# Patient Record
Sex: Male | Born: 1970 | Race: Black or African American | Hispanic: No | Marital: Married | Smoking: Light tobacco smoker
Health system: Southern US, Community
[De-identification: ages and names within clinical notes are randomized; demographics above are authoritative.]

## PROBLEM LIST (undated history)

## (undated) DIAGNOSIS — G4733 Obstructive sleep apnea (adult) (pediatric): Secondary | ICD-10-CM

## (undated) DIAGNOSIS — K219 Gastro-esophageal reflux disease without esophagitis: Secondary | ICD-10-CM

## (undated) DIAGNOSIS — I10 Essential (primary) hypertension: Secondary | ICD-10-CM

## (undated) HISTORY — DX: Essential (primary) hypertension: I10

## (undated) HISTORY — PX: VASECTOMY: SHX75

## (undated) HISTORY — DX: Obstructive sleep apnea (adult) (pediatric): G47.33

---

## 2000-12-03 ENCOUNTER — Emergency Department (HOSPITAL_COMMUNITY): Admission: EM | Admit: 2000-12-03 | Discharge: 2000-12-03 | Payer: Self-pay | Admitting: Emergency Medicine

## 2000-12-03 ENCOUNTER — Encounter: Payer: Self-pay | Admitting: Emergency Medicine

## 2001-10-28 ENCOUNTER — Emergency Department (HOSPITAL_COMMUNITY): Admission: EM | Admit: 2001-10-28 | Discharge: 2001-10-28 | Payer: Self-pay | Admitting: *Deleted

## 2001-11-01 ENCOUNTER — Emergency Department (HOSPITAL_COMMUNITY): Admission: EM | Admit: 2001-11-01 | Discharge: 2001-11-01 | Payer: Self-pay | Admitting: Emergency Medicine

## 2003-01-26 ENCOUNTER — Emergency Department (HOSPITAL_COMMUNITY): Admission: EM | Admit: 2003-01-26 | Discharge: 2003-01-26 | Payer: Self-pay | Admitting: Emergency Medicine

## 2003-02-02 ENCOUNTER — Emergency Department (HOSPITAL_COMMUNITY): Admission: EM | Admit: 2003-02-02 | Discharge: 2003-02-02 | Payer: Self-pay | Admitting: Emergency Medicine

## 2005-01-31 ENCOUNTER — Inpatient Hospital Stay (HOSPITAL_COMMUNITY): Admission: RE | Admit: 2005-01-31 | Discharge: 2005-02-04 | Payer: Self-pay | Admitting: *Deleted

## 2005-03-30 ENCOUNTER — Ambulatory Visit (HOSPITAL_COMMUNITY): Admission: RE | Admit: 2005-03-30 | Discharge: 2005-03-30 | Payer: Self-pay | Admitting: Surgery

## 2005-06-02 ENCOUNTER — Ambulatory Visit (HOSPITAL_COMMUNITY): Admission: RE | Admit: 2005-06-02 | Discharge: 2005-06-02 | Payer: Self-pay | Admitting: Surgery

## 2005-06-09 IMAGING — CT CT ABDOMEN W/ CM
1 of 3 series · 14 of 32 positions shown, 19 images · IV contrast (gastro & omni 300 100ml)
Comparison: none

CLINICAL DATA: Perirectal pain.  Evaluate for abscess.
TECHNIQUE: Contiguous 5 mm axial images of the abdomen and pelvis were obtained after oral and 100 cc of nonionic intravenous contrast was administered.  
CT ABDOMEN WITH CONTRAST:
The liver, spleen, stomach, duodenum, pancreas, gallbladder, adrenal glands, and kidneys have normal features.  No free fluid or adenopathy.

[Series 2: routine abdomen · axial · 0.79mm/px · z∈[-481,-81]mm · 14 of 91 slices shown, 19 images]
[im 6/91  soft-tissue]
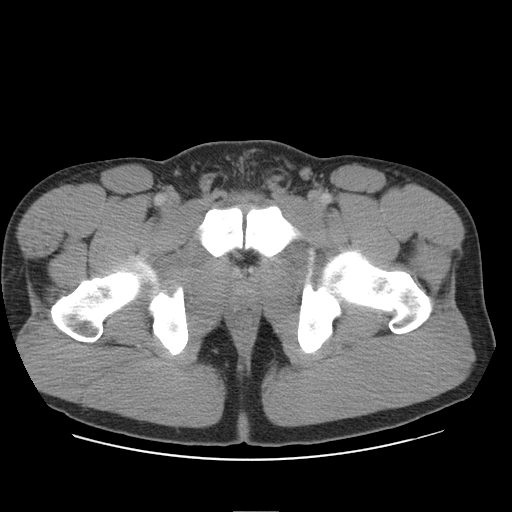
[im 6/91  bone]
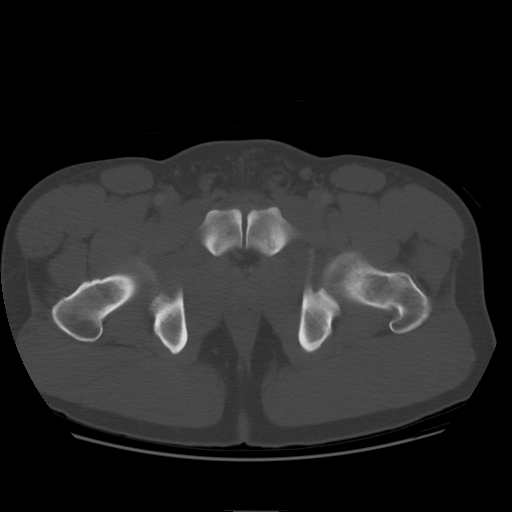
[im 11/91  soft-tissue]
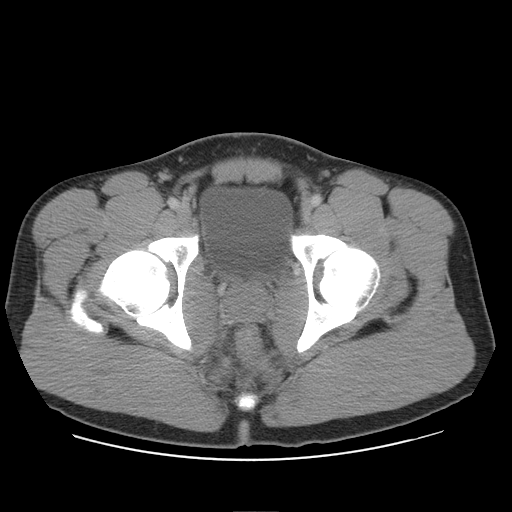
[im 21/91  soft-tissue]
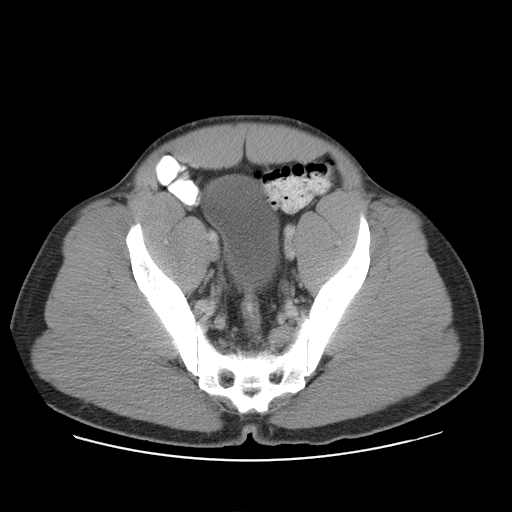
[im 26/91  soft-tissue]
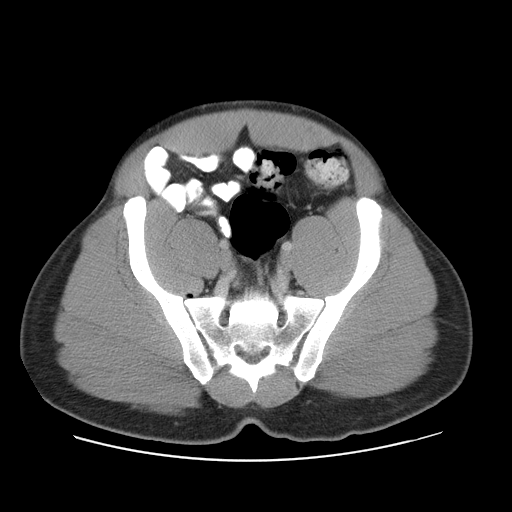
[im 31/91  soft-tissue]
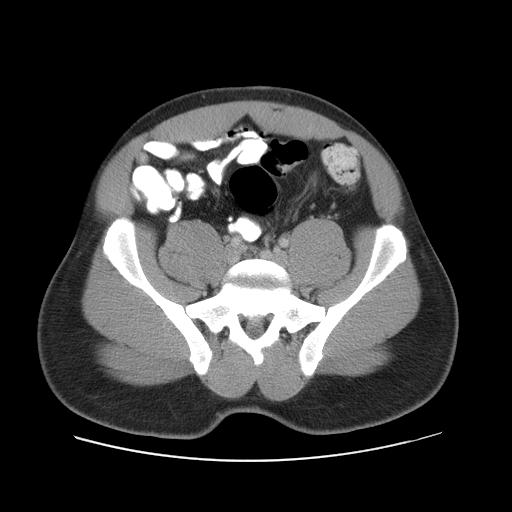
[im 41/91  soft-tissue]
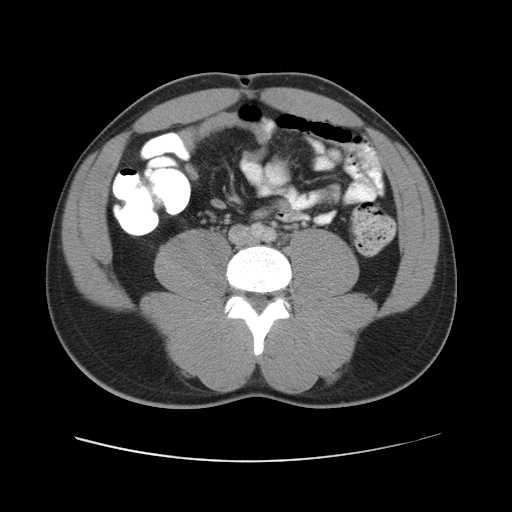
[im 46/91  soft-tissue]
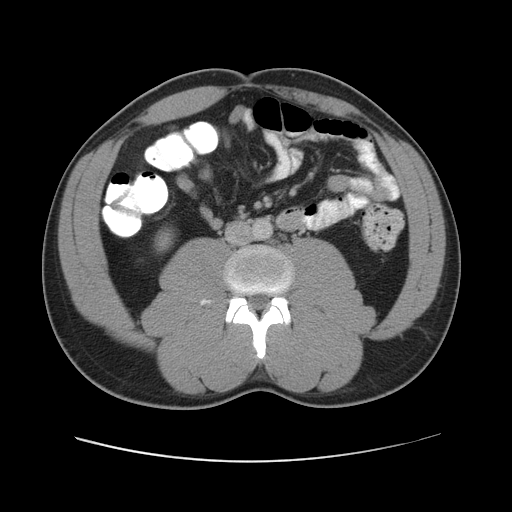
[im 51/91  soft-tissue]
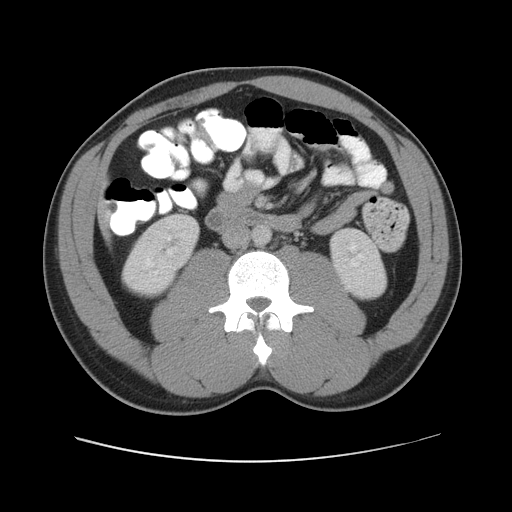
[im 61/91  soft-tissue]
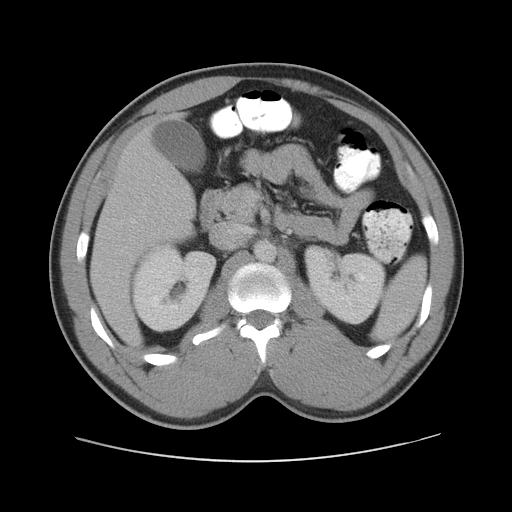
[im 61/91  bone]
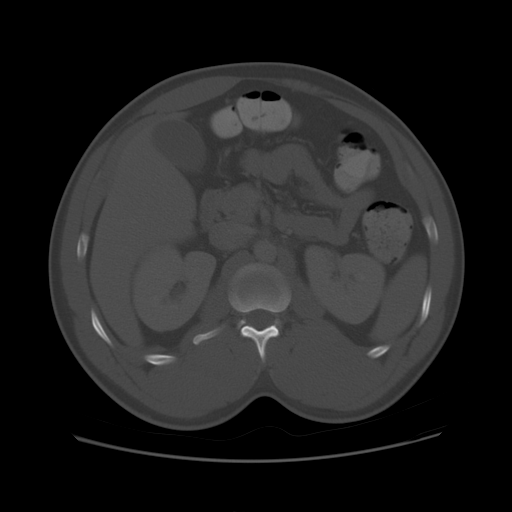
[im 66/91  soft-tissue]
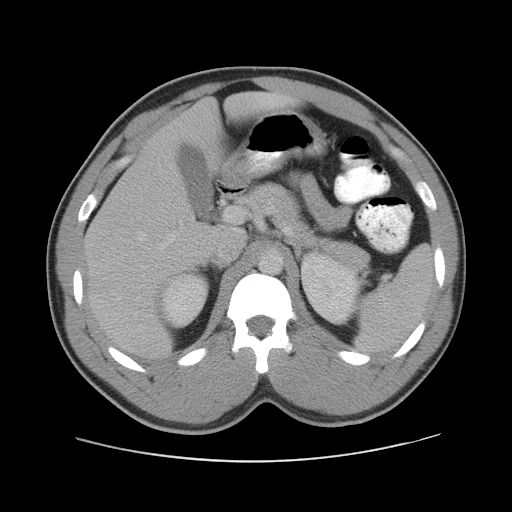
[im 71/91  soft-tissue]
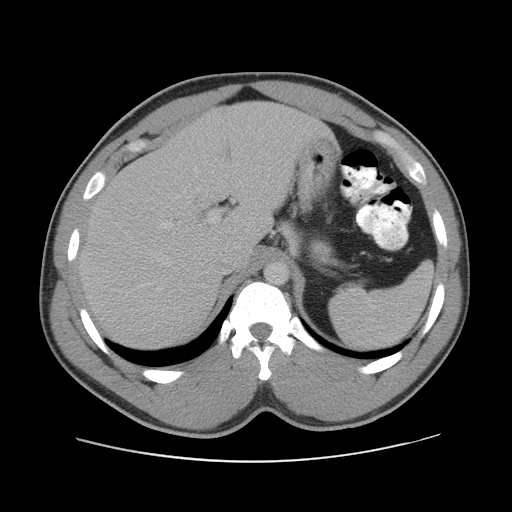
[im 71/91  lung]
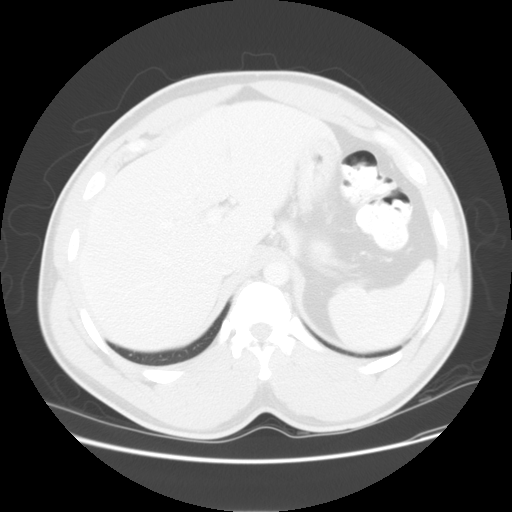
[im 76/91  lung]
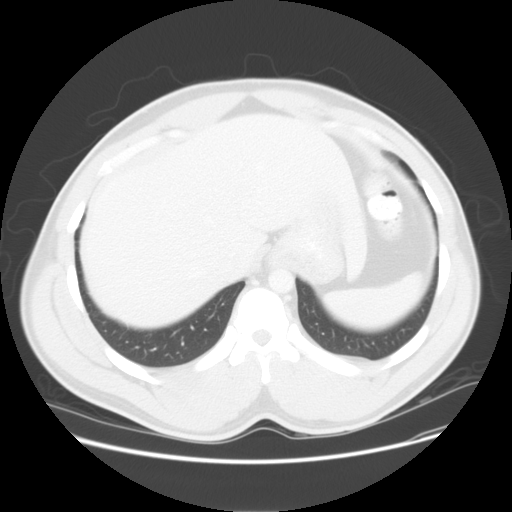
[im 81/91  soft-tissue]
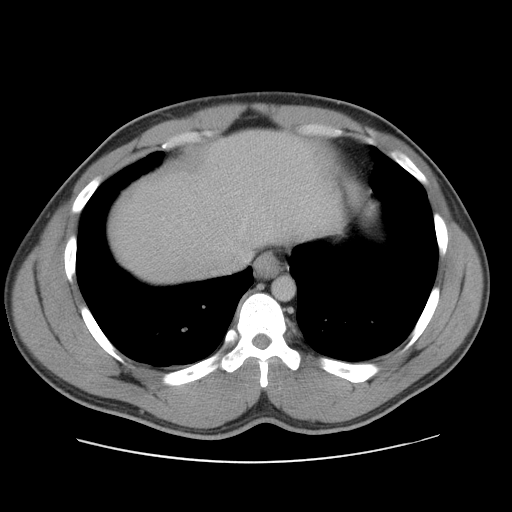
[im 81/91  lung]
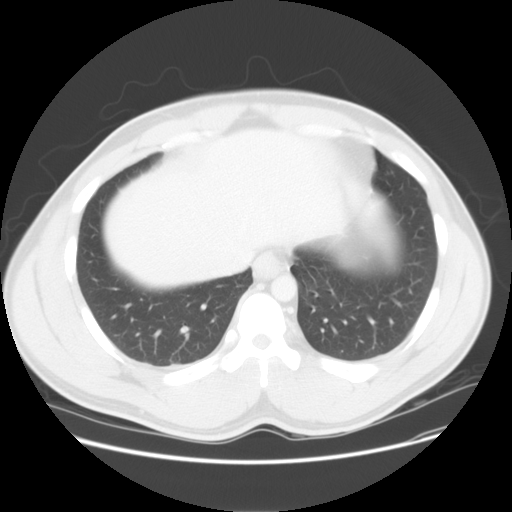
[im 86/91  soft-tissue]
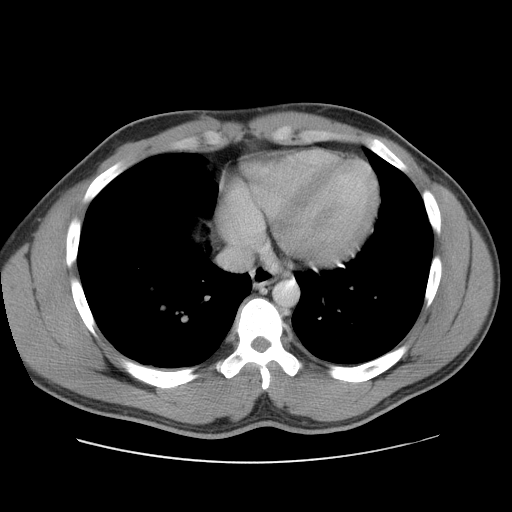
[im 86/91  lung]
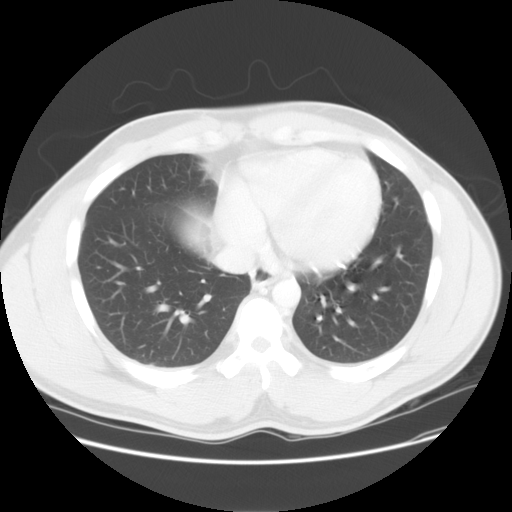

[14 of 32 positions shown; findings below may reference images not displayed]

IMPRESSION: Unremarkable CT exam of the abdomen.  
CT PELVIS WITH CONTRAST:
Imaging through the anatomic pelvis shows no intraperitoneal free fluid.  The appendix and terminal ileum are unremarkable.  
A tiny low density focus is seen in the anterior bladder which is not as low attenuation as would be expected for air although this may be related to volume averaging.
Subtle perirectal edema is noted and at the level of the anus, there is an incompletely visualized fluid collection with air locules, suspicious for perirectal/perianal abscess.  
A tiny locule of air adjacent to the right SI joint is presumably related to the SI joint itself.  It is possible, but considered less likely that this has tracked from the region of the anus.
IMPRESSION: 2.6 x 2.7 cm fluid collection with associated gas locules in the region of the anus is suspicious for a perianal abscess.  he area has been incompletely visualized and it is possible that this fluid is actually within the anus itself although this is considered less likely.

## 2005-06-23 ENCOUNTER — Ambulatory Visit (HOSPITAL_BASED_OUTPATIENT_CLINIC_OR_DEPARTMENT_OTHER): Admission: RE | Admit: 2005-06-23 | Discharge: 2005-06-23 | Payer: Self-pay | Admitting: Surgery

## 2005-06-23 ENCOUNTER — Ambulatory Visit (HOSPITAL_COMMUNITY): Admission: RE | Admit: 2005-06-23 | Discharge: 2005-06-23 | Payer: Self-pay | Admitting: Surgery

## 2005-08-11 ENCOUNTER — Ambulatory Visit (HOSPITAL_BASED_OUTPATIENT_CLINIC_OR_DEPARTMENT_OTHER): Admission: RE | Admit: 2005-08-11 | Discharge: 2005-08-11 | Payer: Self-pay | Admitting: Surgery

## 2005-08-11 ENCOUNTER — Ambulatory Visit (HOSPITAL_COMMUNITY): Admission: RE | Admit: 2005-08-11 | Discharge: 2005-08-11 | Payer: Self-pay | Admitting: Surgery

## 2006-04-21 ENCOUNTER — Encounter (INDEPENDENT_AMBULATORY_CARE_PROVIDER_SITE_OTHER): Payer: Self-pay | Admitting: *Deleted

## 2006-04-21 ENCOUNTER — Ambulatory Visit (HOSPITAL_BASED_OUTPATIENT_CLINIC_OR_DEPARTMENT_OTHER): Admission: RE | Admit: 2006-04-21 | Discharge: 2006-04-21 | Payer: Self-pay | Admitting: Plastic Surgery

## 2007-04-28 ENCOUNTER — Observation Stay (HOSPITAL_COMMUNITY): Admission: EM | Admit: 2007-04-28 | Discharge: 2007-04-29 | Payer: Self-pay | Admitting: *Deleted

## 2011-04-12 NOTE — Op Note (Signed)
NAMEKRZYSZTOF, REICHELT NO.:  [id number]   MEDICAL RECORD NO.:  [id number]          PATIENT TYPE:  INP   LOCATION:  5007                         FACILITY:  [facility]   PHYSICIAN:  Harvie Junior, M.D.   DATE OF BIRTH:  February 14, 1971   DATE OF PROCEDURE:  DATE OF DISCHARGE:  04/28/2007                               OPERATIVE REPORT   PREOPERATIVE DIAGNOSIS:  Patellar tendon rupture, right.   POSTOPERATIVE DIAGNOSIS:  Patellar tendon rupture, right.   PROCEDURE PERFORMED:  Primary repair of patellar tendon rupture, right.   SURGEON:  Harvie Junior, M.D.   ASSISTANT SURGEON:  __________   ANESTHESIA:  General.   BRIEF HISTORY:  Mr. Kadlec is a 40 year old male with a long history of  having had a left patellar tendon rupture at around 30.  The right  patellar tendon ruptured playing basketball.  He was seen in the  emergency room where x-ray showed that he had a high-riding patella and  a  __________ .  Once this was evaluated, the patient could not do  straight leg raising, and he was noted to have a patellar tendon  rupture.  We talked about treatment options, and ultimately felt that  primary repair was the most favorable course of action.  He was brought  to the operating room for this procedure.   DESCRIPTION OF PROCEDURE:  The patient was brought to the operating  room.  After adequate anesthesia under general anesthetic, the patient  was placed on operating table, and the right leg was then prepped and  draped in the usual sterile fashion.  Following this, the leg was  exsanguinated.  The blood pressure tourniquet was inflated to 350 mm Hg.  Following this, a #5 Ethibond was used in the central portion of the  patellar tendon dead center and a figure-of-eight weave medially and  laterally, and a #2 Ethibond in a figure-of-eight weave.  Drill holes  were then placed into the patella x four and parallel from inferior to  superior after roughening up the  surface of the bone for where the  patellar tendon would insert.  Once this was done, the suture were then  passed through the drill hole and the #5 Ethibond suture was tied, the  #2 Ethibond sutures were tied.  Excellent range of motion and stability  of his tendon were achieved and the knee could easily move to 90 degrees  without undue tension on the repair.  There was no gap in the repairs  when it came into extension.  At this point the wound was copiously and  thoroughly irrigated.  The medial and lateral retinacular rents were  repaired with 0 Vicryl running suture; the bursa was repaired with 0  Vicryl running suture; and then the layer was closed with 2-0 Vicryl and  skin staples.  A sterile compressive dressing was applied as well as a  knee immobilizer.  The patient was taken to the recovery room and was  noted to be satisfactory condition.  Estimated blood loss for the  procedure was nil.  Harvie Junior, M.D.  Electronically Signed     JLG/MEDQ  D:  04/28/2007  T:  04/29/2007  Job:  [id number]

## 2011-04-15 NOTE — H&P (Signed)
Wayne Nelson, ROBINETTE NO.:  [id number]   MEDICAL RECORD NO.:  [id number]          PATIENT TYPE:  OBV   LOCATION:  2854                         FACILITY:  [facility]   PHYSICIAN:  Althea Grimmer. Santogade, M.D.DATE OF BIRTH:  Jan 30, 1971   DATE OF ADMISSION:  01/31/2005  DATE OF DISCHARGE:                                HISTORY & PHYSICAL   Mr. Ingam is a  healthy, robust-appearing 40 year old male who presented to  [facility] complaining of constipation and rectal pain  on January 29, 2005.  At that time, he was afebrile.  He described intermittent  severe throbbing in the rectal area.  He was treated with suppositories and  MiraLax.  Symptoms persisted so he was sent for CT scan of the abdomen and  pelvis this morning.  The CT scan showed circumferential rectal wall  thickening suggestive of proctitis with perirectal inflammation in the  posterior rectal fat.  No obvious abscess was seen, however, there was air  in the retroperitoneum along the medial margin of the iliopsoas muscles at  the SI joint.  Patient has been running low grade fevers and having  diaphoresis.  He denies any prior history of similar problems or any  personal or family history of inflammatory bowel disease.  He has not had  any diarrhea or rectal bleeding.   PAST MEDICAL HISTORY:  Pertinent only for reflux.   CURRENT MEDICATION:  Over-the-counter antireflux medicines.   ALLERGIES:  None reported.   FAMILY HISTORY:  Negative for inflammatory bowel disease.   SOCIAL HISTORY:  Lives with girlfriend.  Denies any history of sexually  transmitted diseases, nonsmoker, social drinker.   REVIEW OF SYMPTOMS:  GENERAL:  No weight loss.  Positive for diaphoresis and  sweat.  ENDOCRINE:  No history of diabetes or thyroid problems.  SKIN:  No  rash or pruritus.  EYES:  No icterus or change in vision.  ENT:  No aphthous  ulcers or chronic sore throat.  RESPIRATORY:  No shortness of breath,  cough  or wheezing.  CARDIAC:  No chest pain, palpitations or history of valvular  heart disease.  GI:  As above. GU: No dysuria or hematuria.  Remainder of  review of systems is negative.   PHYSICAL EXAMINATION:  VITAL SIGNS:  Temperature 99.2, blood pressure  144/84, pulse 85 and regular.  SKIN:  Normal.  HEENT:  Eyes:  Anicteric.  Oropharynx unremarkable.  NECK:  Supple without thyromegaly.  There is no cervical or inguinal  adenopathy.  CHEST:  Chest sounds clear.  CARDIOVASCULAR:  Heart sounds regular rate and rhythm.  ABDOMEN:  Overweight and soft without mass.  There may be minimal left lower  quadrant tenderness to deep palpation.  RECTAL:  Exam is quite tender.  I do not appreciate any fissures.   PROCEDURE:  Colonoscopy was performed with sedation and there was no  evidence of inflammatory bowel disease from rectum to cecum.  The ileocecal  valve could not be traversed.  On withdrawal through the rectum on  retroflexed view the rectum was seen to fill with pus which  was suctioned  clean and again pooled indicating that there is some probable abscess  drainage into the rectum.   IMPRESSION:  Probable perirectal abscess which is spontaneously draining  into the rectum as well as into the retroperitoneum.   PLAN:  The patient is admitted to the hospital for IV antibiotics, Zosyn is  ordered.  Blood cultures will be done.  I have also done rectal GC and  chlamydia cultures.  Please see the orders.      PJS/MEDQ  D:  01/31/2005  T:  01/31/2005  Job:  [id number]   cc:   Tracey Harries, M.D.  [address]  [place]  [place] [address]  Fax: [phone]   Margaretmary Bayley, M.D.  [address], [address]  [place]  [place] [address]  Fax: [phone]   Currie Paris, M.D.  [address]  [place]  [place] [address]

## 2011-04-15 NOTE — Op Note (Signed)
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   OPERATION:  Completion anal fistulotomy.   SURGEON:  Dr. Jamey Ripa.   ANESTHESIA:  General.   CLINICAL HISTORY:  Mr. Ben is a 40 year old gentleman who presented many  months ago with a large perirectal abscess which was drained and he  recovered, but developed a fistula. We made one attempt to fix this with  Surgisys fistula plug, but it was a complex fistula and this failed. He has  been brought back to the operating room on a couple of occasions with  partial fistulotomies and placement of seton. At the present time he still  has a seton left and we felt that this was appropriate tine to remove this  and complete the fistulotomy.   DESCRIPTION OF PROCEDURE:  The patient was seen in the holding area and had  no further questions.   He is taken to the operating room and after satisfactory general anesthesia  had been obtained, he was placed in lithotomy position and the perianal area  prepped and draped. The time-out occurred.   I injected 0.25% Marcaine with epi around the area of the fistula. It had  been a fairly long fistula tract both internal and external. Externally it  had healed over to the level of the seton. I then did an anoscopic exam and  internally it had healed again just to the level of fistula and the residual  tissue seemed to be just a very small perhaps 2 mm band.   I divided the remaining fistula with cautery. I debrided the fistula tract  with a 4x4 to rough it up and make sure there is no  epithelization left to  interfere with final healing. I then made sure everything was dry using  cautery.   Sterile dressings applied. The patient tolerated the procedure well.  There  were no complications.      Currie Paris, M.D.  Electronically Signed     CJS/MEDQ  D:  08/11/2005  T:  08/11/2005  Job:  [id number]

## 2011-04-15 NOTE — Op Note (Signed)
NAMEHICKS, FEICK NO.:  [id number]   MEDICAL RECORD NO.:  [id number]          PATIENT TYPE:  INP   LOCATION:  5729                         FACILITY:  [facility]   PHYSICIAN:  Currie Paris, M.D.DATE OF BIRTH:  07/09/1971   DATE OF PROCEDURE:  02/02/2005  DATE OF DISCHARGE:                                 OPERATIVE REPORT   PREOPERATIVE DIAGNOSIS:  Perirectal abscess.   POSTOPERATIVE DIAGNOSIS:  Perirectal abscess.   OPERATION PERFORMED:  Examination under anesthesia with drainage of  perirectal abscess.   SURGEON:  Currie Paris, M.D.   ANESTHESIA:  General.   INDICATIONS FOR PROCEDURE:  This is a 40 year old gentleman who presented  with some rectal pain and initial colonoscopic evaluation because of a  question of colitis on a CT scan resulted in the spontaneous drainage of  what appeared to be a perirectal abscess.  He was placed on IV antibiotics  but continued to have perianal, perirectal pain and follow-up CT today  showed development of a collection which had not been apparent on a prior  CT.  He was therefore brought to the operating room for further drainage.   DESCRIPTION OF PROCEDURE:  The patient was seen in the holding area and had  no further questions.  The patient was taken to the operating room and after  satisfactory general anesthesia, the perianal area was prepped and draped.  In doing so, we noticed purulent material drainage per anus.  The time out  occurred.   I placed an anoscope and could see an opening drainage purulent material in  the rectum.  A digital exam showed a perirectal abscess which I was able to  break up some loculations digitally.  Cultures were taken.  This thing  tracked anteriorly and to the right anterolateral and beyond the level of  the sphincters.  It was very superficial so I elected to make a  counterincision here so we could get both external and internal drainage.  I  did not make this incision  very large because I felt most of his drainage  was opened interiorly and there was a fairly large internal draining area.  I placed a Penrose drain through so that both areas would stay open and felt  that with this drainage established, hopefully this would improve but this  was a fairly large abscess cavity noted.  The patient tolerated the  procedure well.  There were no operative complications. Counts were correct.      CJS/MEDQ  D:  02/02/2005  T:  02/02/2005  Job:  [id number]

## 2011-04-15 NOTE — Op Note (Signed)
[name], Wayne Nelson NO.:  [id number]   MEDICAL RECORD NO.:  [id number]          PATIENT TYPE:  AMB   LOCATION:  DSC                          FACILITY:  [facility]   PHYSICIAN:  Currie Paris, M.D.DATE OF BIRTH:  11-30-1970   DATE OF PROCEDURE:  DATE OF DISCHARGE:                                 OPERATIVE REPORT   [facility] [phone].   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   OPERATION:  Debridement of fistula in ano with placement of seton.   SURGEON:  Dr. Jamey Ripa   ANESTHESIA:  General.   CLINICAL HISTORY:  40 year old gentleman had a complex fistula in ano  develop following drainage of a large perirectal abscess several months ago.  He is brought the operating few weeks ago where we were able to do a partial  fistulectomy for the external part, but it went fairly deep around a lot of  sphincter muscle so a seton was placed.   He was seen in the office and clinically had improved and the seton was  still in place but we were unable to manipulate this in the office with a  local so that we decided to bring him back to the operating room to do a  better exam and debridement.   DESCRIPTION OF PROCEDURE:  The patient was seen in the holding area and he  had no further questions. He was taken to have room and after satisfactory  general anesthesia had been obtained, was placed lithotomy position. The  time out occurred.   The fistula tract still was opened with the seton in place.  I was able to  readily put a hemostat through this. It had clearly shortened down such that  we only had about a centimeter of tissue left that had not cut through.  Distal to the anus there was a lot of shaggy granulations and these were  roughly debrided with edge of a gauze. I did not see any other evidence of  any separate entry into the rectum.   I replaced the previous seton with another two sutures of 0 silk,  having  taken out the original two. These were both tied down snugly. I felt at this  point these might well cut through and hopefully eliminate this or perhaps  just need a  partial fistulotomy one last time under anesthesia depending on how this  did. Prior to starting, I did inject about 10 cc of 0.25% Marcaine with epi.  The patient tolerated procedure well and there were no operative  complications. The wound was packed. All counts were correct.       CJS/MEDQ  D:  06/23/2005  T:  06/23/2005  Job:  [id number]

## 2011-04-15 NOTE — Op Note (Signed)
NAME:  Wayne Nelson, Wayne Nelson NO.:  [id number]   MEDICAL RECORD NO.:  [id number]          PATIENT TYPE:  AMB   LOCATION:  DAY                          FACILITY[facility]   PHYSICIAN:  Currie Paris, M.D.DATE OF BIRTH:  10/26/1971   DATE OF PROCEDURE:  03/30/2005  DATE OF DISCHARGE:                                 OPERATIVE REPORT   OFFICE MEDICAL RECORD NUMBER:  [id number].   PREOPERATIVE DIAGNOSIS:  Fistula in ano, status post drainage of large  perirectal abscess.   POSTOPERATIVE DIAGNOSIS:  Fistula in ano, status post drainage of large  perirectal abscess - complex fistula with two external openings.   OPERATION:  Exam under anesthesia, with partial fistulotomy and placement of  Surgisys fistula plug.   SURGEON:  Currie Paris, M.D.   ANESTHESIA:  General.   CLINICAL HISTORY:  This is a 40 year old gentleman who several weeks ago  underwent drainage of a large perirectal abscess.  He has done well but has  had a continuous fistulous drainage.  There were two small openings which I  thought communicated subcutaneously on the right anterior aspect of his  perianal area.  After discussion with the patient, we elected to bring him  to the operating room for possible fistulectomy, fistulotomy, or placement  of a Surgisys fistula plug.   DESCRIPTION OF PROCEDURE:  The patient was seen in the holding area and had  no further questions.  He was taken to the operating room, and after  satisfactory general anesthesia had been obtained, he was placed in the  lithotomy position, and the perianal area was prepped and draped.  The  timeout occurred.   On exam, he had two openings externally anteriorly, one more about the 11  o'clock position, and more about the 10 o'clock position on the right  anterior.  Using an anoscope, I could not definitely see an internal  opening, but using the more anterior of the two fistula openings, I was  readily able to get a probe  into the rectum.  A probe was placed in the  second fistulous opening, and this seemed to track toward the first opening,  not directly inside.  I temporarily removed the probe and irrigated both  tracts with peroxide, trying to just debride out any debris.  I then  prepared the Surgisys plug and tied a suture to the small tip end of it.  I  put a probe back into the rectum and brought the plug through so that I  could see how it fit.  I then selected where I would cut the internal part  of it off so that we had a good fit and placed a 3-0 Vicryl suture and tied  it there.  I pulled it back into the tract and made sure we had it fitting  properly.  Then, we cut off the excess.  The catheter was pulled into the  tract, and using the still attached needle to the suture, closed the  submucosa and mucosa over the fistula plug, incorporating the plug into the  suture and tying it down.  A second suture was done to re-anchor this and be  sure we had good mucosal closure over the fistula plug.   Externally, we had this pulled up so that it was snug, and I used a 3-0  Vicryl to anchor it to the skin, but left the fistulous tract opening open,  and cut this off flush with the skin.  I then used the cautery to  communicate the second  fistula tract over to the plug and just left this open as an external  fistulotomy.  I injected Marcaine with epinephrine to help with the  postoperative analgesia.   The patient tolerated the procedure well.  There were no complications.  All  counts were correct.      CJS/MEDQ  D:  03/30/2005  T:  03/30/2005  Job:  [id number]

## 2011-04-15 NOTE — Consult Note (Signed)
Wayne Nelson, MAYOTTE NO.:  [id number]   MEDICAL RECORD NO.:  [id number]          PATIENT TYPE:  OBV   LOCATION:  5729                         FACILITY:  [facility]   PHYSICIAN:  Currie Paris, M.D.DATE OF BIRTH:  12/09/1970   DATE OF CONSULTATION:  02/01/2005  DATE OF DISCHARGE:                                   CONSULTATION   REASON FOR CONSULTATION:  Rectal abscess.   HISTORY OF PRESENT ILLNESS:  Mr. Wayne Nelson is a 40 year old male patient with a  1 week history of perirectal pain.  He sought initial medial therapy from  his primary care physician.  CT scan revealed a circumferential rectal wall  thickening with air at the retroperitoneal location with questionable  microperforation.  He was then admitted to the hospital and Dr. Luther Parody  had performed a colonoscopy which showed pus pooling in the rectal area and  felt that the perirectal abscess had spontaneously ruptured.  He was placed  on IV Zosyn and a general surgery consult was obtained.  The patient does  admit that he still has some pain, although he does feel like there is some  relief today.   ALLERGIES:  No known drug allergies.   MEDICATIONS:  None prior to admission.  He is currently on Zosyn.   PAST MEDICAL HISTORY:  No pertinent past medical history.   PAST SURGICAL HISTORY:  Right knee surgery in the past.   SOCIAL HISTORY:  He lives in [place] with his girlfriend.  Denies any  tobacco or illicit drug use.  He does drink socially.   FAMILY HISTORY:  No family history of colon cancers or major health issues.  Parents alive and well.   REVIEW OF SYSTEMS:  GASTROINTESTINAL:  Painful bowel habits.  GENITOURINARY:  Denies any rectal oozing.  All other review of systems are negative.   PHYSICAL EXAMINATION:  VITAL SIGNS:  Temperature 98.8, pulse 72,  respirations 20, blood pressure 130/60, O2 saturations 95% on room air.  GENERAL:  He is in no acute distress.  HEENT:  Normal.  NECK:   No JVD or thyromegaly.  CHEST:  Clear to auscultation bilaterally.  No wheeze, rales or rhonchi.  HEART:  Regular rate and rhythm with no murmur.  ABDOMEN:  Normoactive bowel sounds, nontender, nondistended, no masses or  bruits.  RECTAL:  Perirectal tenderness, right greater than left.  There is no pus  noted.  No open lesions.  EXTREMITIES:  No cyanosis or clubbing of lower extremities.  No peripheral  edema.  NEUROLOGIC:  Cranial nerves 2-12 grossly intact.   LABORATORY DATA AND X-RAY FINDINGS:  Blood cultures x2 with no growth x1  day.  Sodium 132, potassium 3.6, BUN 8, creatinine 1.1.  LFTs are normal.  CBC reveals a white count of 9.6, hemoglobin 14.5, hematocrit 41.7,  platelets 273.   ASSESSMENT:  Perirectal abscess with probable spontaneous rupture.   RECOMMENDATIONS:  We will check a pelvic computed tomography with rectal  contrast in the morning to see progress of this area.  In the meantime, will  continue intravenous Zosyn.  The patient was seen  and examined by Dr.  Currie Paris, M.D.      LB/MEDQ  D:  02/01/2005  T:  02/01/2005  Job:  [id number]   cc:   Currie Paris, M.D.  [address], [address]  [place]  [place] [address]

## 2011-04-15 NOTE — Op Note (Signed)
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   OPERATION:  Rectal exam under anesthesia with partial fistulotomy and  placement of seton.   SURGEON:  Dr. Jamey Ripa.   ANESTHESIA:  General.   CLINICAL HISTORY:  This a 40 year old man who presented several months ago  with a large perirectal abscess. We had a resultant fistula in ano and about  two months ago we took him back to the operating room and placed a SurgiSys  plug, but he had a complex fistula with two external openings and the plug  failed with persistent fistula. He is brought back to the operating room  with plans to try to do an open fistulotomy. It was felt that the internal  opening is fairly high up and we might not be able to open this completely  because of risk of injury to the sphincter.   DESCRIPTION OF PROCEDURE:  The patient was seen in the holding area and he  had no further questions.  He was taken to the operating room and after  satisfactory general anesthesia had been obtained, he was placed in the  prone position. The perineal area and rectal area was prepped and draped as  sterile field. Time-out occurred.   With an anoscope placed in the rectum, I could see a little inflammatory  process slightly to the right of midline posteriorly. I was able to irrigate  some peroxide through both external openings and see they appeared to  communicate and enter into a single opening internally. I was then able to  place readily a rectal probe through each of these and they both came into  the same  internal opening. 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 I debrided that somewhat using rough gauze and left that opened. We  then had a single relatively short tract into the inside.  Rather than cut  through all the posterior sphincter I then placed a seton of #1 silk tying  it down snugly but not too tight.   I felt at this point that the best approach would be to try to let this heal  externally so that we were then left with a relatively short fistula which  would be controlled with a seton. Either in the office or in the operating  room we could replace the seton and try to get this to completely heal  depending on  how the external areas did.  Another alternative might be an  attempt again with a plug given that we would have hopefully a more  simplified fistula.   I injected some 0.25% Marcaine with epi to help control postop pain and I  placed dressing.   The patient tolerated procedure well. No operative complications. All counts  correct.       CJS/MEDQ  D:  06/02/2005  T:  06/02/2005  Job:  [id number]

## 2011-04-15 NOTE — Op Note (Signed)
NAMEDENORRIS, REUST NO.:  [id number]   MEDICAL RECORD NO.:  [id number]          PATIENT TYPE:  AMB   LOCATION:  DSC                          FACILITY:  [facility]   PHYSICIAN:  Etter Sjogren, M.D.     DATE OF BIRTH:  [birth year]   DATE OF PROCEDURE:  04/21/2006  DATE OF DISCHARGE:                                 OPERATIVE REPORT   PREOPERATIVE DIAGNOSIS:  1.  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  2.  Lesion of undetermined behavior, left thigh.  3.  Lesion of undetermined behavior, left knee.   POSTOPERATIVE DIAGNOSES:  1.  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  2.  Lesion of undetermined behavior, left thigh.  3.  Lesion of undetermined behavior, left knee.  4.  Complicated wound scalp greater than 2.5 cm.   PROCEDURE PERFORMED:  1.  Complex wound closure scalp greater than 2.5 cm.  2.  Excision lesion of undetermined behavior, left thigh greater than 1 cm.  3.  Excision lesion of left knee greater than 1 cm.   SURGEON:  Etter Sjogren, M.D.   ANESTHESIA:  1% Xylocaine with epinephrine plus bicarb.   CLINICAL NOTE:  This 40 year old man has a couple of lesions, one left  thigh, one left knee, that have been large and changed, he would like to  have those removed.  In addition, he has a scar on his scalp that has become  a cheloid and is expanding and is symptomatic.  It is painful and it is  medically necessary to remove it.  The anesthesia and the risks discussed  with him and he understood the procedure as well as the possibility of  recurrence of the cheloid on his scalp and wished to proceed.   PROCEDURE:  The patient was placed in the supine position.  The sites were  marked.  He was prepped with Betadine and draped with sterile drapes.  Successful awakening from anesthesia achieved.  The excisions were  performed.  The scalp wound cleansed thoroughly and closed with staples,  taking care to align the skin edges.   The  remainder of the closure is with #4-0 Prolene simple interrupted sutures.  Antibiotic ointment and a dry sterile dressing was applied and tolerated  well.   DISPOSITION:  Return to the office in 10 days.      Etter Sjogren, M.D.  Electronically Signed     DB/MEDQ  D:  04/21/2006  T:  04/21/2006  Job:  [id number]

## 2011-04-15 NOTE — Discharge Summary (Signed)
NAMERANDLE, SHATZER NO.:  [id number]   MEDICAL RECORD NO.:  [id number]          PATIENT TYPE:  INP   LOCATION:  5729                         FACILITY:  [facility]   PHYSICIAN:  Guy Franco, P.A.       DATE OF BIRTH:  [birth year]   DATE OF ADMISSION:  01/31/2005  DATE OF DISCHARGE:  02/04/2005                                 DISCHARGE SUMMARY   DISCHARGE DIAGNOSES:  1.  Perirectal abscess, status post drainage on February 02, 2005.  2.  Status post right knee surgery.   HOSPITAL COURSE:  Wayne Nelson is a 40 year old male patient with a one-week  history of perirectal pain.  He was seen by his primary care physician, and  a CT showed a circumferential rectal wall thickening, with air at the  retroperitoneum location, with questionable microperforation.  He was sent  to the hospital, and Dr. Luther Nelson performed a colonoscopy which showed pus  pooling in the rectum which was thought to be a probable spontaneously  ruptured perirectal abscess.  He was placed on IV Zosyn, and a follow-up CT  was performed.  The CT showed a 2.6 x 2.7 cm perianal abscess, and plans  were made for the patient to go to the operating room for drainage.  This  occurred on February 02, 2005.  The patient tolerated the procedure well.  During the postoperative period, he seemed to tolerate his recovery well.  He was medicated with pain medicine, and at this point all cultures remain  pending.  We are planning to discharge him to home on postoperative day two  in stable condition.  We will discharge him on the following medications:   1.  Augmentin 500 mg one p.o. b.i.d. for 10 days.  2.  Vicodin 1-2 tablets q.6 h. as needed for pain, #40, no refills.  3.  I have recommended Colace 100 mg daily to take over the next two weeks      as a stool softener.   He is instructed not to strain, and no weightlifting for two weeks.  He is  to remain out of work until February 14, 2005.  He is to remain on a lowfat  diet, gentle wound care with soaks.  He is to call [phone] for questions or  concerns.  He has a follow-up appointment with Dr. Jamey Nelson on Wednesday February 09, 2005 at 9:00 a.m.      LB/MEDQ  D:  02/04/2005  T:  02/04/2005  Job:  [id number]   cc:   __________, M.D.   Wayne Nelson. Wayne Nelson, M.D.  [address]  [place]  [place] [address]  Fax: [phone]   Wayne Nelson, M.D.  [address]  [place]  [place] [address]

## 2011-12-21 ENCOUNTER — Ambulatory Visit (INDEPENDENT_AMBULATORY_CARE_PROVIDER_SITE_OTHER): Payer: 59

## 2011-12-21 DIAGNOSIS — R059 Cough, unspecified: Secondary | ICD-10-CM

## 2011-12-21 DIAGNOSIS — R05 Cough: Secondary | ICD-10-CM

## 2011-12-21 DIAGNOSIS — J029 Acute pharyngitis, unspecified: Secondary | ICD-10-CM

## 2011-12-21 DIAGNOSIS — J019 Acute sinusitis, unspecified: Secondary | ICD-10-CM

## 2011-12-26 ENCOUNTER — Ambulatory Visit (INDEPENDENT_AMBULATORY_CARE_PROVIDER_SITE_OTHER): Payer: 59

## 2011-12-26 DIAGNOSIS — J019 Acute sinusitis, unspecified: Secondary | ICD-10-CM

## 2011-12-26 DIAGNOSIS — T730XXA Starvation, initial encounter: Secondary | ICD-10-CM

## 2011-12-26 DIAGNOSIS — R21 Rash and other nonspecific skin eruption: Secondary | ICD-10-CM

## 2012-02-21 ENCOUNTER — Ambulatory Visit (INDEPENDENT_AMBULATORY_CARE_PROVIDER_SITE_OTHER): Payer: 59 | Admitting: Internal Medicine

## 2012-02-21 ENCOUNTER — Encounter: Payer: Self-pay | Admitting: Internal Medicine

## 2012-02-21 VITALS — BP 132/85 | HR 87 | Temp 98.9°F | Resp 16 | Ht 74.0 in | Wt 272.6 lb

## 2012-02-21 DIAGNOSIS — L299 Pruritus, unspecified: Secondary | ICD-10-CM

## 2012-02-21 DIAGNOSIS — R300 Dysuria: Secondary | ICD-10-CM

## 2012-02-21 DIAGNOSIS — Z113 Encounter for screening for infections with a predominantly sexual mode of transmission: Secondary | ICD-10-CM

## 2012-02-21 LAB — POCT URINALYSIS DIPSTICK
Bilirubin, UA: NEGATIVE
Glucose, UA: NEGATIVE
Leukocytes, UA: NEGATIVE
Nitrite, UA: NEGATIVE
pH, UA: 6

## 2012-02-21 LAB — POCT UA - MICROSCOPIC ONLY: Casts, Ur, LPF, POC: NEGATIVE

## 2012-02-21 NOTE — Progress Notes (Signed)
  Subjective:    Patient ID: Wayne Eves., male    DOB: 09/06/1971, 41 y.o.   MRN: [id number]  Rash This is a new problem. The current episode started 1 to 4 weeks ago. The problem is unchanged. There is no history of allergies, asthma or eczema.  Wayne Nelson is here with two complaints:  First he has been itching "all over" for about two weeks with no rash evident.  He switched soaps but still itches despite taking oral Benadryl.  He has no history of asthma or eczema.  He also complains of dysuria which is intermittent.  Recent new sexual partner and no condom use.  Denies drainage.    Review of Systems  HENT: Negative.   Respiratory: Negative.   Cardiovascular: Negative.   Gastrointestinal: Negative.   Skin: Positive for rash.  All other systems reviewed and are negative.       Objective:   Physical Exam  Vitals reviewed. Constitutional: He is oriented to person, place, and time. He appears well-developed and well-nourished.  HENT:  Head: Normocephalic.  Mouth/Throat: Oropharynx is clear and moist.  Eyes: Conjunctivae are normal.  Neck: Neck supple.  Cardiovascular: Normal rate, regular rhythm and normal heart sounds.   Pulmonary/Chest: Effort normal and breath sounds normal.  Abdominal: Soft.  Lymphadenopathy:    He has no cervical adenopathy.  Neurological: He is alert and oriented to person, place, and time.  Skin: Skin is warm and dry.       Sleeve tattoos, no skin lesions noted but skin globally dry  Psychiatric: He has a normal mood and affect. His behavior is normal.          Assessment & Plan:  U/a and dip appear normal.  Uriprobe pending.  Wear condoms! For his itching skin we will place him on antihistamine twice daily and Zantac twice daily for one week.  Use Cetaphil lotion for dry skin.  RTC if not improving or symptoms worsen.

## 2012-02-21 NOTE — Patient Instructions (Addendum)
For your skin itching:  Take Zyrtec 10 mg and Zantac 75 mg twice daily for one week.  Apply Cetaphil lotion to skin after bathing and again in 12 hours to soothe any itch.  Use Dove soap, avoid soaps with fragrance.  Avoid dryer towels and detergents with fragrance.  We will call you with the results of your GC/Chlamydia test in 3 days.

## 2012-02-22 LAB — GC/CHLAMYDIA PROBE AMP, URINE: GC Probe Amp, Urine: NEGATIVE

## 2012-03-14 ENCOUNTER — Ambulatory Visit (INDEPENDENT_AMBULATORY_CARE_PROVIDER_SITE_OTHER): Payer: 59 | Admitting: Physician Assistant

## 2012-03-14 VITALS — BP 150/82 | HR 80 | Temp 98.7°F | Resp 16 | Ht 76.0 in | Wt 277.6 lb

## 2012-03-14 DIAGNOSIS — M25519 Pain in unspecified shoulder: Secondary | ICD-10-CM

## 2012-03-14 DIAGNOSIS — M25511 Pain in right shoulder: Secondary | ICD-10-CM

## 2012-03-14 MED ORDER — MELOXICAM 15 MG PO TABS: 15.0000 mg | ORAL_TABLET | Freq: Every day | ORAL | Status: AC

## 2012-03-14 NOTE — Progress Notes (Signed)
  Subjective:    Patient ID: Wayne Eves., male    DOB: Aug 12, 1971, 41 y.o.   MRN: [id number]  HPI Wayne Nelson is a 41 y/o AA male who presents today with cc right shoulder pain.  He states that he woke up with it. He cannot recall that he slept on it or had any injury.He also has no known history of previous injury to the joint.  He does lift heavy weights at the gym and worked arms and chest 2 days ago. Pain is very localized to the lateral, proximal head or the humerus. Patient has not taken any meds to alleviate pain. Pain is worse with movement of the joint especially abduction and internal rotation. He rates it ar a 7/10 at its worst. He is having difficulty with ADLs such as driving his car.   Denies clicking, grinding or popping. Denies tingling or numbness in his hands.    Review of Systems    as stated in HPI Objective:   Physical Exam  Constitutional: He appears well-developed and well-nourished.       Heavily muscled individual  Musculoskeletal: Normal range of motion.       Right shoulder: He exhibits tenderness (TTP of the tendon of the short head of the biceps) and pain (active ROM is not limited, but patient grimaces with movement). He exhibits no bony tenderness, no swelling, no effusion, no crepitus, no deformity, no laceration, no spasm, normal pulse and normal strength.          Assessment & Plan:   1. Shoulder pain, right  meloxicam (MOBIC) 15 MG tablet  supportive care, see patient instructions

## 2012-03-14 NOTE — Progress Notes (Signed)
Examined with student and agree. csj

## 2012-03-14 NOTE — Patient Instructions (Signed)
Shoulder Pain The shoulder is a ball and socket joint. The muscles and tendons (rotator cuff) are what keep the shoulder in its joint and stable. This collection of muscles and tendons holds in the head (ball) of the humerus (upper arm bone) in the fossa (cup) of the scapula (shoulder blade). Today no reason was found for your shoulder pain. Often pain in the shoulder may be treated conservatively with temporary immobilization. For example, holding the shoulder in one place using a sling for rest. Physical therapy may be needed if problems continue. HOME CARE INSTRUCTIONS   Apply ice to the sore area for 15 to 20 minutes, 3 to 4 times per day for the first 2 days. Put the ice in a plastic bag. Place a towel between the bag of ice and your skin.   If you have or were given a shoulder sling and straps, do not remove for as long as directed by your caregiver or until you see a caregiver for a follow-up examination. If you need to remove it to shower or bathe, move your arm as little as possible.   Sleep on several pillows at night to lessen swelling and pain.   Only take over-the-counter or prescription medicines for pain, discomfort, or fever as directed by your caregiver.   Keep any follow-up appointments in order to avoid any type of permanent shoulder disability or chronic pain problems.  SEEK MEDICAL CARE IF:   Pain in your shoulder increases or new pain develops in your arm, hand, or fingers.   Your hand or fingers are colder than your other hand.   You do not obtain pain relief with the medications or your pain becomes worse.  SEEK IMMEDIATE MEDICAL CARE IF:   Your arm, hand, or fingers are numb or tingling.   Your arm, hand, or fingers are swollen, painful, or turn white or blue.   You develop chest pain or shortness of breath.  MAKE SURE YOU:   Understand these instructions.   Will watch your condition.   Will get help right away if you are not doing well or get worse.    Document Released: 08/24/2005 Document Revised: 11/03/2011 Document Reviewed: 10/29/2011 [employer] Patient Information 2012 [employer]. 

## 2012-12-06 ENCOUNTER — Ambulatory Visit (INDEPENDENT_AMBULATORY_CARE_PROVIDER_SITE_OTHER): Payer: 59 | Admitting: Family Medicine

## 2012-12-06 VITALS — BP 133/87 | HR 83 | Temp 98.0°F | Resp 16 | Ht 73.0 in | Wt 274.8 lb

## 2012-12-06 DIAGNOSIS — M549 Dorsalgia, unspecified: Secondary | ICD-10-CM

## 2012-12-06 DIAGNOSIS — IMO0002 Reserved for concepts with insufficient information to code with codable children: Secondary | ICD-10-CM

## 2012-12-06 DIAGNOSIS — S39012A Strain of muscle, fascia and tendon of lower back, initial encounter: Secondary | ICD-10-CM

## 2012-12-06 MED ORDER — HYDROCODONE-ACETAMINOPHEN 5-500 MG PO TABS: 1.0000 | ORAL_TABLET | ORAL | Status: DC | PRN

## 2012-12-06 MED ORDER — CYCLOBENZAPRINE HCL 10 MG PO TABS: 10.0000 mg | ORAL_TABLET | Freq: Three times a day (TID) | ORAL | Status: DC | PRN

## 2012-12-06 MED ORDER — NABUMETONE 750 MG PO TABS: 750.0000 mg | ORAL_TABLET | Freq: Two times a day (BID) | ORAL | Status: DC

## 2012-12-06 MED ORDER — KETOROLAC TROMETHAMINE 60 MG/2ML IM SOLN
60.0000 mg | Freq: Once | INTRAMUSCULAR | Status: AC
  Administered 2012-12-06: 60 mg via INTRAMUSCULAR

## 2012-12-06 NOTE — Progress Notes (Signed)
Subjective: Patient was weight lifting at the gym yesterday. When he lifted the bar off of the rash he had his back tighten out. The weights weighed close to 500 pounds. He is than this before, about a year ago, he had his back strain. He lifts weights about 5 days a week. 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  Objective: Muscular man in obvious distress, unable to sit up or move around without obvious pain. There is no radiating pain reported. He is very tight in the paraspinous muscles of the low back, and somewhat around over the iliac crest areas. When he flexes forward the spine remains very straight.  Assessment: Lumbar strain  Plan: Muscle relaxants anti-inflammatory medications and pain relievers. Recheck in 4-5 days to see if he needs physical therapy.

## 2012-12-06 NOTE — Patient Instructions (Signed)
Alternate heat and ice packs on your low back several times daily  Try to stretch the back gently.  Stay off work until you're seen on Monday. We may need to see if physical therapy or other rehabilitation for this back as needed.

## 2012-12-06 NOTE — Addendum Note (Signed)
Addended by: Arnita Koons H on: 12/06/2012 09:35 AM   Modules accepted: Orders

## 2013-04-22 ENCOUNTER — Other Ambulatory Visit: Payer: Self-pay | Admitting: Neurology

## 2013-06-23 ENCOUNTER — Other Ambulatory Visit: Payer: Self-pay | Admitting: Family Medicine

## 2013-06-24 ENCOUNTER — Telehealth: Payer: Self-pay

## 2013-06-24 ENCOUNTER — Ambulatory Visit (INDEPENDENT_AMBULATORY_CARE_PROVIDER_SITE_OTHER): Payer: 59 | Admitting: Family Medicine

## 2013-06-24 VITALS — BP 140/90 | HR 83 | Temp 98.6°F | Resp 18 | Ht 73.0 in | Wt 274.0 lb

## 2013-06-24 DIAGNOSIS — IMO0002 Reserved for concepts with insufficient information to code with codable children: Secondary | ICD-10-CM

## 2013-06-24 DIAGNOSIS — M62830 Muscle spasm of back: Secondary | ICD-10-CM

## 2013-06-24 DIAGNOSIS — S39012A Strain of muscle, fascia and tendon of lower back, initial encounter: Secondary | ICD-10-CM

## 2013-06-24 MED ORDER — CYCLOBENZAPRINE HCL 10 MG PO TABS: 10.0000 mg | ORAL_TABLET | Freq: Three times a day (TID) | ORAL | Status: DC | PRN

## 2013-06-24 MED ORDER — HYDROCODONE-ACETAMINOPHEN 5-325 MG PO TABS: 1.0000 | ORAL_TABLET | Freq: Four times a day (QID) | ORAL | Status: DC | PRN

## 2013-06-24 MED ORDER — KETOROLAC TROMETHAMINE 60 MG/2ML IM SOLN
60.0000 mg | Freq: Once | INTRAMUSCULAR | Status: AC
  Administered 2013-06-24: 60 mg via INTRAMUSCULAR

## 2013-06-24 MED ORDER — NABUMETONE 750 MG PO TABS: 750.0000 mg | ORAL_TABLET | Freq: Two times a day (BID) | ORAL | Status: DC

## 2013-06-24 NOTE — Patient Instructions (Addendum)
I recommend keeping the nabumetone on board - esp taking before and during work. Then when you come home, take a muscle relaxant followed by 15 minutes of heat followed by gentle stretching. Try to do this regiment 2 - 3 times a day.  If you are still having pain in 4 to 6 wks, come back to clinic for further eval. RTC immed if symptoms worsen or you develop any other concerning symptoms below.  Start considering looking into some yoga classes to prevent future injuries

## 2013-06-24 NOTE — Telephone Encounter (Signed)
Pt still c/o of lower back pain and would like to know if he could have a refill on the muscle relaxer that was prescribed to him by Dr.Hopper (pt does not know the name of the medication), pt also is aware that Dr.Hopper is not in the office at this time but needs this medication asap. Best# [phone]   [facility] [address]

## 2013-06-24 NOTE — Progress Notes (Signed)
Subjective:    Patient ID: Wayne Eves., male    DOB: 03-Jul-1971, 42 y.o.   MRN: [id number] Chief Complaint  Patient presents with  . Back Pain    x 2-3 days     HPI  Must have overdone it at the gym as when he got out of bed yesterday had severe pain.  Better with moving but if he is sitting or laying down for longer it gets worse. Is in center of his low back without radiation, no numbness or weakness in legs though legs feel week due to pain initially at times.  Feels similar to other episodes in the past. No f/c, no changes in bowels/bladder.  Past Medical History  Diagnosis Date  . Hypertension    Current Outpatient Prescriptions on File Prior to Visit  Medication Sig Dispense Refill  . amLODipine-olmesartan (AZOR) 5-40 MG per tablet Take 1 tablet by mouth daily.      . Armodafinil (NUVIGIL) 150 MG tablet Take 1 tablet (150 mg total) by mouth daily as needed (for daytime sleepiness).  30 tablet  5  . loratadine (CLARITIN) 10 MG tablet Take 10 mg by mouth daily.      Marland Kitchen omeprazole (PRILOSEC) 20 MG capsule Take 20 mg by mouth daily.       No current facility-administered medications on file prior to visit.   No Known Allergies   Review of Systems  Constitutional: Negative for fever and chills.  Gastrointestinal: Negative for abdominal pain, diarrhea and constipation.  Genitourinary: Negative for urgency, frequency, decreased urine volume and difficulty urinating.  Musculoskeletal: Positive for myalgias and back pain. Negative for gait problem.  Skin: Negative for color change, rash and wound.  Neurological: Negative for dizziness, weakness, light-headedness and numbness.      BP 140/90  Pulse 83  Temp(Src) 98.6 F (37 C) (Oral)  Resp 18  Ht 6\' 1"  (1.854 m)  Wt 274 lb (124.286 kg)  BMI 36.16 kg/m2  SpO2 98% Objective:   Physical Exam  Constitutional: He is oriented to person, place, and time. He appears well-developed and well-nourished. No distress.  HENT:   Head: Normocephalic and atraumatic.  Cardiovascular: Intact distal pulses.   Pulmonary/Chest: Effort normal.  Musculoskeletal: He exhibits no edema and no tenderness.       Thoracic back: He exhibits normal range of motion, no tenderness, no bony tenderness, no swelling, no deformity and no spasm.       Lumbar back: He exhibits decreased range of motion and spasm. He exhibits no tenderness, no bony tenderness, no edema and no deformity.  Negative straight leg raise bilaterally  Neurological: He is alert and oriented to person, place, and time. He has normal strength. He displays no atrophy. No sensory deficit. He exhibits normal muscle tone. Coordination and gait normal.  Reflex Scores:      Patellar reflexes are 0 on the right side and 0 on the left side.      Achilles reflexes are 1+ on the right side and 1+ on the left side. Poor patellar reflexes due to h/o bilateral patellar surgery  Skin: Skin is warm and dry. No rash noted. He is not diaphoretic. No erythema.  Psychiatric: He has a normal mood and affect. His behavior is normal.      Assessment & Plan:  Lumbar paraspinal muscle spasm - Plan: ketorolac (TORADOL) injection 60 mg  Back strain, initial encounter - Plan: cyclobenzaprine (FLEXERIL) 10 MG tablet, nabumetone (RELAFEN) 750 MG tablet  Meds  ordered this encounter  Medications  . ketorolac (TORADOL) injection 60 mg    Sig:   . cyclobenzaprine (FLEXERIL) 10 MG tablet    Sig: Take 1 tablet (10 mg total) by mouth 3 (three) times daily as needed for muscle spasms.    Dispense:  30 tablet    Refill:  1  . nabumetone (RELAFEN) 750 MG tablet    Sig: Take 1 tablet (750 mg total) by mouth 2 (two) times daily.    Dispense:  30 tablet    Refill:  1  . HYDROcodone-acetaminophen (NORCO/VICODIN) 5-325 MG per tablet    Sig: Take 1 tablet by mouth every 6 (six) hours as needed for pain.    Dispense:  20 tablet    Refill:  1

## 2013-06-24 NOTE — Telephone Encounter (Signed)
Spoke w/pt who reported that his back has started bothering him again after lifting weights again this week. Pt agreed to RTC today to be rechecked.

## 2015-04-06 ENCOUNTER — Ambulatory Visit (INDEPENDENT_AMBULATORY_CARE_PROVIDER_SITE_OTHER): Payer: 59 | Admitting: Neurology

## 2015-04-06 ENCOUNTER — Encounter: Payer: Self-pay | Admitting: Neurology

## 2015-04-06 VITALS — BP 140/82 | HR 88 | Resp 20 | Ht 74.41 in | Wt 283.0 lb

## 2015-04-06 DIAGNOSIS — G4726 Circadian rhythm sleep disorder, shift work type: Secondary | ICD-10-CM | POA: Diagnosis not present

## 2015-04-06 DIAGNOSIS — G4733 Obstructive sleep apnea (adult) (pediatric): Secondary | ICD-10-CM

## 2015-04-06 DIAGNOSIS — Z9989 Dependence on other enabling machines and devices: Principal | ICD-10-CM

## 2015-04-06 NOTE — Progress Notes (Signed)
SLEEP MEDICINE CLINIC   Provider:  Melvyn Novasarmen  Dondrell Loudermilk, M D  Referring Provider: Rolly PancakePatterson, Robert, MD Primary Care Physician:  Rolly PancakePATTERSON,ROBERT, MD  Chief Complaint  Patient presents with  . Follow-up    cpap, rm 10, alone    HPI:  Wayne EvesCharles R Beers Jr. is an african american right handed  44 y.o. male and   seen here as a referral/ revisit  from Dr. Jarold MottoPatterson for sleep apnea follow up.  Mr. Derrell Lollingngram was last seen by me on 01-28-13. On 04-28-09 he was evaluated for sleep apnea ( AHI 31.8)  and a split-night protocol titration was taking place. He was using the machine regularly but has not been initially had not been initially able to maintain it. The machine could not provide a download and therefore we changed it to a to a ResMed machine. The split study had documented that a setting of 7 cm water pressure eliminated the AHI. But a pressure of 13 cm water was needed to eliminate the snoring.  He initially had trouble getting adjusted to CPAP but soon felt that his sleep improved. The last download on file was from 1-20 8-13, and documented a residual AHI of 2.2 at 11 cm water pressure with 2 cm EPR. Time the patient was 87% compliance 26 out of 30 days.Prior to the study, he had endorsed the Epworth sleepiness score at 22 points, and after CPAP use this number became 11 points. He reported sleeping longer and felt rested.On 01-28-13 the evaluated his machine last here for CPAP compliance. At the same time he reported that he still has high blood pressures also he was treated on amlodipine and lisinopril. His CPAP was increased to 11 cm water was to send meter EPR AHI was 4.1 and he used it daily over 90 days with an average of 6 hours.  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  In spite of his changing jobs and  shift work he reports that he is using CPAP daily and very compliantly. Epworth score today is endorsed at 10 points and his fatigue severity score is 31 points both are in average range.  The patient reports that he now needs a little longer to fall asleep when he puts his CPAP on and has to do some breathing exercises almost a little meditation to settle. But he is not insomnia by any means. His changing sleep cycles affect of course the hours of sleep he can get through the day. He reports that he sweats more often or sweats more at night. He is a regular exerciser and visits the gym at least 2 times a week. His fiance has noticed that he has restless sleep, moving about to fall asleep and  is moving restlessly during his sleep.  No nocturia. Usually sleeps 6 hours , some nights 7.5 hours. He feels refreshed and restored , no headaches . He sleeps in a cool bedroom but not quiet and dark bedroom, that he shares with his fiancee, he likes to sleep on his back.    Review of Systems: Out of a complete 14 system review, the patient complains of only the following symptoms, and all other reviewed systems are negative.   Epworth score  10, Fatigue severity score  31  , depression score 3    History   Social History  . Marital Status: Single    Spouse Name: N/A  . Number of Children: N/A  . Years of Education: N/A   Occupational History  . Not on file.   Social History Main Topics  . Smoking status: Never Smoker   . Smokeless tobacco: Not on file  . Alcohol Use: No     Comment: 1 beer a week  . Drug Use: No  . Sexual Activity: Not on file   Other Topics Concern  . Not on file   Social History Narrative   No caffeine consumption.    Family History  Problem Relation Age of Onset  . Hypertension Father     Past Medical History  Diagnosis Date  . Hypertension   . OSA (obstructive sleep apnea)     Past Surgical History  Procedure Laterality Date  . Vasectomy       Current Outpatient Prescriptions  Medication Sig Dispense Refill  . amLODipine (NORVASC) 5 MG tablet Take 5 mg by mouth daily.  9  . Armodafinil (NUVIGIL) 150 MG tablet Take 1 tablet (150 mg total) by mouth daily as needed (for daytime sleepiness). 30 tablet 5  . irbesartan (AVAPRO) 300 MG tablet Take 300 mg by mouth daily.  12  . loratadine (CLARITIN) 10 MG tablet Take 10 mg by mouth daily.    Marland Kitchen omeprazole (PRILOSEC) 20 MG capsule Take 20 mg by mouth daily.    Marland Kitchen amLODipine-olmesartan (AZOR) 5-40 MG per tablet Take 1 tablet by mouth daily.    . cyclobenzaprine (FLEXERIL) 10 MG tablet Take 1 tablet (10 mg total) by mouth 3 (three) times daily as needed for muscle spasms. (Patient not taking: Reported on 04/06/2015) 30 tablet 1  . HYDROcodone-acetaminophen (NORCO/VICODIN) 5-325 MG per tablet Take 1 tablet by mouth every 6 (six) hours as needed for pain. (Patient not taking: Reported on 04/06/2015) 20 tablet 1  . nabumetone (RELAFEN) 750 MG tablet Take 1 tablet (750 mg total) by mouth 2 (two) times daily. (Patient not taking: Reported on 04/06/2015) 30 tablet 1   No current facility-administered medications for this visit.    Allergies as of 04/06/2015  . (No Known Allergies)    Vitals: BP 140/82 mmHg  Pulse 88  Resp 20  Ht 6' 2.41" (1.89 m)  Wt 283 lb (128.368 kg)  BMI 35.94 kg/m2 Last Weight:  Wt Readings from Last 1 Encounters:  04/06/15 283 lb (128.368 kg)       Last Height:   Ht Readings from Last 1 Encounters:  04/06/15 6' 2.41" (1.89 m)    Physical exam:  General: The patient is awake, alert and appears not in acute distress. The patient is well groomed. Head: Normocephalic, atraumatic. Neck is supple. Mallampati 4,  neck circumference: 22. Nasal airflow  unrestricted , TMJ is not  evident . Retrognathia is  seen.  Cardiovascular:  Regular rate and rhythm, without  murmurs or carotid bruit, and without distended neck veins. Respiratory: Lungs are clear to  auscultation. Skin:  Without evidence of edema, or rash Trunk: BMI is  elevated and patient  has normal posture.  Neurologic exam : The patient is awake and alert, oriented to place and time.   Memory subjective described as intact.  There is a normal attention span & concentration ability. Speech is fluent without dysarthria, dysphonia or aphasia. Mood and affect are appropriate.  Cranial nerves: Pupils are equal and briskly  reactive to light. Funduscopic exam without  evidence of pallor or edema. Extraocular movements  in vertical and horizontal planes intact and without nystagmus.  Visual fields by finger perimetry are intact. Hearing to finger rub intact.  Facial sensation intact to fine touch. Facial motor strength is symmetric and tongue and uvula move midline.  Motor exam:  Normal tone, muscle bulk and symmetric ,strength in all extremities.  Sensory:  Fine touch, pinprick and vibration were tested in all extremities.  Proprioception is normal.  Coordination: Rapid alternating movements in the fingers/hands is normal. Finger-to-nose maneuver  normal without evidence of ataxia, dysmetria or tremor.  Gait and station: Patient walks without assistive device and is able unassisted to climb up to the exam table.  Strength within normal limits. Stance is stable and normal. Tandem gait is unfragmented. Romberg  negative.  Deep tendon reflexes: in the  upper and lower extremities are symmetric and intact. Babinski maneuver response is downgoing.   Assessment:  After physical and neurologic examination, review of laboratory studies, imaging, neurophysiology testing and pre-existing records, 45 minute  assessment is :  1)  OSA compliant CPAP user, download from 04-05-15 reveals a residual AHI of 3.690 day user compliance of 100% and 83% for over 4 hours, average daily use a time 5 hours and 55 minutes. Set pressure 11 cm water was 2 cm EPR. 2)  Obesity, neck 22, BMI - his main risk factor.   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 Addressed,  Lamb I will increase his CPAP pressure by 1 cm. To 12 cm with 2 cm EPR   The patient was advised of the nature of the diagnosed sleep disorder , the treatment options and risks for general a health and wellness arising from not treating the condition. Visit duration was 45 minutes.   Plan:  Treatment plan and additional workup :  increase pressure, advanced home care.  Patient needs new equipment including new tube filter headgear.    Porfirio Mylararmen Marlei Glomski MD  04/06/2015

## 2015-11-02 ENCOUNTER — Ambulatory Visit (INDEPENDENT_AMBULATORY_CARE_PROVIDER_SITE_OTHER): Payer: 59 | Admitting: Emergency Medicine

## 2015-11-02 VITALS — BP 122/84 | HR 87 | Temp 98.4°F | Resp 18 | Ht 74.0 in | Wt 280.4 lb

## 2015-11-02 DIAGNOSIS — S6722XA Crushing injury of left hand, initial encounter: Secondary | ICD-10-CM | POA: Diagnosis not present

## 2015-11-02 DIAGNOSIS — M79645 Pain in left finger(s): Secondary | ICD-10-CM | POA: Diagnosis not present

## 2015-11-02 NOTE — Progress Notes (Signed)
Subjective:  Patient ID: Gabriela Eves., male    DOB: March 22, 1971  Age: 44 y.o. MRN: [id number]  CC: Finger Injury   HPI Chantry Headen. presents  about a month ago he suffered a crush injury of his left index finger when a door was closed on it. He has had continued discomfort in the tip of his finger. The prominent reason for his visit is because the nail is delaminating and he wants to have it debrided.  History Fidel has a past medical history of Hypertension and OSA (obstructive sleep apnea).   He has past surgical history that includes Vasectomy.   His  family history includes Hypertension in his father.  He   reports that he has never smoked. He does not have any smokeless tobacco history on file. He reports that he does not drink alcohol or use illicit drugs.  Outpatient Prescriptions Prior to Visit  Medication Sig Dispense Refill  . amLODipine (NORVASC) 5 MG tablet Take 5 mg by mouth daily.  9  . irbesartan (AVAPRO) 300 MG tablet Take 300 mg by mouth daily.  12  . loratadine (CLARITIN) 10 MG tablet Take 10 mg by mouth daily.    Marland Kitchen omeprazole (PRILOSEC) 20 MG capsule Take 20 mg by mouth daily.    Marland Kitchen amLODipine-olmesartan (AZOR) 5-40 MG per tablet Take 1 tablet by mouth daily.    . Armodafinil (NUVIGIL) 150 MG tablet Take 1 tablet (150 mg total) by mouth daily as needed (for daytime sleepiness). (Patient not taking: Reported on 11/02/2015) 30 tablet 5  . cyclobenzaprine (FLEXERIL) 10 MG tablet Take 1 tablet (10 mg total) by mouth 3 (three) times daily as needed for muscle spasms. (Patient not taking: Reported on 04/06/2015) 30 tablet 1  . HYDROcodone-acetaminophen (NORCO/VICODIN) 5-325 MG per tablet Take 1 tablet by mouth every 6 (six) hours as needed for pain. (Patient not taking: Reported on 04/06/2015) 20 tablet 1  . nabumetone (RELAFEN) 750 MG tablet Take 1 tablet (750 mg total) by mouth 2 (two) times daily. (Patient not taking: Reported on 04/06/2015) 30 tablet 1    No facility-administered medications prior to visit.    Social History   Social History  . Marital Status: Single    Spouse Name: N/A  . Number of Children: N/A  . Years of Education: N/A   Social History Main Topics  . Smoking status: Never Smoker   . Smokeless tobacco: None  . Alcohol Use: No     Comment: 1 beer a week  . Drug Use: No  . Sexual Activity: Not Asked   Other Topics Concern  . None   Social History Narrative   No caffeine consumption.     Review of Systems  Constitutional: Negative for fever, chills and appetite change.  HENT: Negative for congestion, ear pain, postnasal drip, sinus pressure and sore throat.   Eyes: Negative for pain and redness.  Respiratory: Negative for cough, shortness of breath and wheezing.   Cardiovascular: Negative for leg swelling.  Gastrointestinal: Negative for nausea, vomiting, abdominal pain, diarrhea, constipation and blood in stool.  Endocrine: Negative for polyuria.  Genitourinary: Negative for dysuria, urgency, frequency and flank pain.  Musculoskeletal: Negative for gait problem.  Skin: Negative for rash.  Neurological: Negative for weakness and headaches.  Psychiatric/Behavioral: Negative for confusion and decreased concentration. The patient is not nervous/anxious.     Objective:  BP 122/84 mmHg  Pulse 87  Temp(Src) 98.4 F (36.9 C) (Oral)  Resp 18  Ht 6\' 2"  (1.88 m)  Wt 280 lb 6.4 oz (127.189 kg)  BMI 35.99 kg/m2  SpO2 98%  Physical Exam  Constitutional: He is oriented to person, place, and time. He appears well-developed and well-nourished.  HENT:  Head: Normocephalic and atraumatic.  Eyes: Conjunctivae are normal. Pupils are equal, round, and reactive to light.  Pulmonary/Chest: Effort normal.  Musculoskeletal: He exhibits no edema.  Neurological: He is alert and oriented to person, place, and time.  Skin: Skin is dry.  Psychiatric: He has a normal mood and affect. His behavior is normal. Thought  content normal.   His index finger nail has come loose and is delaminating and there is no tenderness of the fingertip    Assessment & Plan:   Leonette MostCharles was seen today for finger injury.  Diagnoses and all orders for this visit:  Crushing injury of finger of left hand, initial encounter  Pain of finger of left hand   I am having Mr. Derrell Lollingngram maintain his amLODipine-olmesartan, omeprazole, loratadine, Armodafinil, cyclobenzaprine, nabumetone, HYDROcodone-acetaminophen, amLODipine, and irbesartan.  No orders of the defined types were placed in this encounter.   After a suitable infiltration with lidocaine in a digital block the nail was removed  Appropriate red flag conditions were discussed with the patient as well as actions that should be taken.  Patient expressed his understanding.  Follow-up: No Follow-up on file.  Carmelina DaneAnderson, Derral Colucci S, MD

## 2016-02-10 ENCOUNTER — Encounter: Payer: Self-pay | Admitting: Neurology

## 2016-04-05 ENCOUNTER — Ambulatory Visit: Payer: 59 | Admitting: Neurology

## 2016-05-04 ENCOUNTER — Ambulatory Visit (INDEPENDENT_AMBULATORY_CARE_PROVIDER_SITE_OTHER): Payer: 59 | Admitting: Neurology

## 2016-05-04 ENCOUNTER — Encounter: Payer: Self-pay | Admitting: Neurology

## 2016-05-04 VITALS — BP 142/98 | HR 84 | Resp 20 | Ht 73.0 in | Wt 293.0 lb

## 2016-05-04 DIAGNOSIS — G4710 Hypersomnia, unspecified: Secondary | ICD-10-CM

## 2016-05-04 DIAGNOSIS — G4733 Obstructive sleep apnea (adult) (pediatric): Secondary | ICD-10-CM

## 2016-05-04 DIAGNOSIS — G4730 Sleep apnea, unspecified: Secondary | ICD-10-CM | POA: Diagnosis not present

## 2016-05-04 DIAGNOSIS — Z9989 Dependence on other enabling machines and devices: Secondary | ICD-10-CM

## 2016-05-04 MED ORDER — MODAFINIL 200 MG PO TABS: 200.0000 mg | ORAL_TABLET | Freq: Every day | ORAL | Status: DC

## 2016-05-04 NOTE — Progress Notes (Addendum)
SLEEP MEDICINE CLINIC   Provider:  Melvyn Novas, M D  Referring Provider:  Jarome Matin, MD   Chief Complaint  Patient presents with  . Follow-up    cpap, going well, rm 11, alone    HPI:  Wayne Nelson. is an african american right handed  45 y.o. male and   seen here as a referral/ revisit  from Dr. Jarome Matin for sleep apnea follow up.   On 04-28-09 he was first evaluated for sleep apnea ( AHI 31.8)  and a split-night protocol titration was taking place. He was using the machine regularly but has not been initially had not been initially able to maintain it. The machine could not provide a download and therefore we changed it to a to a ResMed machine. The split study had documented that a setting of 7 cm water pressure eliminated the AHI. But a pressure of 13 cm water was needed to eliminate the snoring. He initially had trouble getting adjusted to CPAP but soon felt that his sleep improved. The last download on file was from 1-20 8-13, and documented a residual AHI of 2.2 at 11 cm water pressure with 2 cm EPR. Time the patient was 87% compliance 26 out of 30 days.Prior to the study, he had endorsed the Epworth sleepiness score at 22 points, and after CPAP use this number became 11 points. He reported sleeping longer and felt rested.On 01-28-13 the evaluated his machine last here for CPAP compliance. At the same time he reported that he still has high blood pressures also he was treated on amlodipine and lisinopril. His CPAP was increased to 11 cm water was to send meter EPR AHI was 4.1 and he used it daily over 90 days with an average of 6 hours.   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  In spite of his changing jobs and shift work he reports that he is using CPAP daily and very compliantly.  Epworth score today is endorsed at 10 points and his fatigue severity score is 31 points both are in average range. The patient reports that he now needs a little longer to fall asleep when he puts his CPAP on and has to do some breathing exercises almost a little meditation to settle. But he is not insomnia by any means. His changing sleep cycles affect of course the hours of sleep he can get through the day. He reports that he sweats more often or sweats more at night. He is a regular exerciser and visits the gym at least 2 times a week. His fiance has noticed that he has restless sleep, moving about to fall asleep and  is moving restlessly during his sleep. No nocturia. Usually sleeps 6 hours , some nights 7.5 hours. He feels refreshed and restored , no headaches . He sleeps in a cool bedroom but not quiet and dark bedroom, that he shares with his fiancee, he likes to sleep on his back.   Interval history from 05/04/2016. I meeting was office [name] today for his revisit after CPAP titration this is his yearly visit. 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 He has been highly compliant with CPAP use. 100%  of days and 87% of those over 4 hours of nightly use. The machine recorded in average of 5 hours and 28 minutes of use at 11 cm water pressure with 2 cm EPR. The residual AHI is 4.1 all residual apneas are obstructive in nature but he does have some significant air leakage and on some nights I believe this artificially increases his AHI. He also reports that he takes a while to wind down at night before he is able to go to sleep and takes a few mindful breathing exercises. He has used an FX nasal pillow and this dislodges very easily. His fatigue severity score was endorsed today at 29 and his Epworth sleepiness score at 15 points, it is the Epworth sleepiness score that concerns me. I would like for him to be refitted for a different interface and he  is interested in the dream wear mask by [employer]. I will ask his DME to provide him some arthritis to the current model. He is using an extended holes. With the dream ware mask he may be happy a lower risk of entanglling himself. He has noted some libido issues, and these may be related to fatigue. He does not have a history of spinal cord injury, multiple sclerosis, bowel or bladder dysfunction.   Review of Systems: Out of a complete 14 system review, the patient complains of only the following symptoms, and all other reviewed systems are negative. Epworth score up to 15 from  10, Fatigue severity score 29 from  31  , depression score 3 .   Social History   Social History  . Marital Status: Single    Spouse Name: N/A  . Number of Children: N/A  . Years of Education: N/A   Occupational History  . Not on file.   Social History Main Topics  . Smoking status: Never Smoker   . Smokeless tobacco: Not on file  . Alcohol Use: No     Comment: 1 beer a week  . Drug Use: No  . Sexual Activity: Not on file   Other Topics Concern  . Not on file   Social History Narrative   No caffeine consumption.    Family History  Problem Relation Age of Onset  . Hypertension Father     Past Medical History  Diagnosis Date  . Hypertension   . OSA (obstructive sleep apnea)     Past Surgical History  Procedure Laterality Date  . Vasectomy      Current Outpatient Prescriptions  Medication Sig Dispense Refill  . amLODipine (NORVASC) 5 MG tablet Take 5 mg by mouth daily.  9  . irbesartan (AVAPRO) 300 MG tablet Take 300 mg by mouth daily.  12  . loratadine (CLARITIN) 10 MG tablet Take 10 mg by mouth daily.    . modafinil (PROVIGIL) 200 MG tablet 200 mg.    . omeprazole (PRILOSEC) 20 MG capsule Take 20 mg by mouth daily.     No current facility-administered medications for this visit.    Allergies as of 05/04/2016  . (No Known Allergies)    Vitals: BP 142/98 mmHg  Pulse  84  Resp 20  Ht 6\' 1"  (1.854 m)  Wt 293 lb (132.904 kg)  BMI 38.67 kg/m2 Last Weight:  Wt Readings from Last 1 Encounters:  05/04/16 293 lb (132.904 kg)       Last Height:   Ht Readings from Last 1 Encounters:  05/04/16 6\' 1"  (1.854 m)    Physical exam:  General: The patient is awake, alert and appears not in acute distress. The patient is well groomed. Head: Normocephalic, atraumatic. Neck is supple. Mallampati 4,  neck circumference: 22. Nasal airflow  unrestricted , TMJ is not  evident . Retrognathia is  seen.  Cardiovascular:  Regular rate and rhythm, without  murmurs or carotid bruit, and without distended neck veins. Respiratory: Lungs are clear to auscultation. Skin:  Without evidence of edema, or rash Trunk: BMI is  elevated and patient  has normal posture.  Neurologic exam : The patient is awake and alert, oriented to place and time.   Memory subjective described as intact.  There is a normal attention span & concentration ability. Speech is fluent without dysarthria, dysphonia or aphasia. Mood and affect are appropriate. Cranial nerves: Pupils are equal and briskly reactive to light. Funduscopic exam without  evidence of pallor or edema. Extraocular movements  in vertical and horizontal planes intact and without nystagmus.  Visual fields by finger perimetry are intact. Hearing to finger rub intact.  Facial sensation intact to fine touch. Facial motor strength is symmetric and tongue and uvula move midline.   Assessment:  After physical and neurologic examination, review of laboratory studies, imaging, neurophysiology testing and pre-existing records, 45 minute  assessment is :  1)  OSA compliant CPAP user, download user compliance of 100% for days  and 87% for over 4 hours, average daily use a time 5 hours and and 28 min. CPAP pressure at 11 cm with 2 cm EPR.  AHI 4.1    2)  Obesity, neck 22, BMI - his main risk factor.  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 Addressed,   The patient was advised of the nature of the diagnosed sleep disorder , the treatment options and risks for general a health and wellness arising from not treating the condition. Visit duration was 25 minutes.   Plan:  Treatment plan and additional workup :  increase pressure, advanced home care.  Patient needs new equipment including new tube filter headgear. He would like to address the libido issues with his  PCP next month, had in the past access to Viagra and feels he wants this again.    Rv in 12 month.     Porfirio Mylararmen Tynesha Free MD  05/04/2016  CC Dr. Jarome Matinaniel Paterson, MD

## 2016-07-18 ENCOUNTER — Ambulatory Visit (INDEPENDENT_AMBULATORY_CARE_PROVIDER_SITE_OTHER): Payer: 59 | Admitting: Family Medicine

## 2016-07-18 VITALS — BP 130/78 | HR 81 | Temp 98.7°F | Resp 16 | Ht 73.0 in | Wt 290.8 lb

## 2016-07-18 DIAGNOSIS — L237 Allergic contact dermatitis due to plants, except food: Secondary | ICD-10-CM | POA: Diagnosis not present

## 2016-07-18 MED ORDER — TRIAMCINOLONE ACETONIDE 0.1 % EX CREA: 1.0000 "application " | TOPICAL_CREAM | Freq: Three times a day (TID) | CUTANEOUS | 0 refills | Status: DC | PRN

## 2016-07-18 NOTE — Patient Instructions (Addendum)
Try the steroid cream to the affected areas on her forearms 2-3 times per day until itching has improved. He can also try over-the-counter Claritin once per day if needed for itching. If any spread of rash to neck, face, or other spreading rash, return for recheck as you may need to be on prednisone at that time.   Poison [employer] Poison ivy is a inflammation of the skin (contact dermatitis) caused by touching the allergens on the leaves of the ivy plant following previous exposure to the plant. The rash usually appears 48 hours after exposure. The rash is usually bumps (papules) or blisters (vesicles) in a linear pattern. Depending on your own sensitivity, the rash may simply cause redness and itching, or it may also progress to blisters which may break open. These must be well cared for to prevent secondary bacterial (germ) infection, followed by scarring. Keep any open areas dry, clean, dressed, and covered with an antibacterial ointment if needed. The eyes may also get puffy. The puffiness is worst in the morning and gets better as the day progresses. This dermatitis usually heals without scarring, within 2 to 3 weeks without treatment. HOME CARE INSTRUCTIONS  Thoroughly wash with soap and water as soon as you have been exposed to poison ivy. You have about one half hour to remove the plant resin before it will cause the rash. This washing will destroy the oil or antigen on the skin that is causing, or will cause, the rash. Be sure to wash under your fingernails as any plant resin there will continue to spread the rash. Do not rub skin vigorously when washing affected area. Poison ivy cannot spread if no oil from the plant remains on your body. A rash that has progressed to weeping sores will not spread the rash unless you have not washed thoroughly. It is also important to wash any clothes you have been wearing as these may carry active allergens. The rash will return if you wear the unwashed clothing, even  several days later. Avoidance of the plant in the future is the best measure. Poison ivy plant can be recognized by the number of leaves. Generally, poison ivy has three leaves with flowering branches on a single stem. Diphenhydramine may be purchased over the counter and used as needed for itching. Do not drive with this medication if it makes you drowsy.Ask your caregiver about medication for children. SEEK MEDICAL CARE IF:  Open sores develop.  Redness spreads beyond area of rash.  You notice purulent (pus-like) discharge.  You have increased pain.  Other signs of infection develop (such as fever).   This information is not intended to replace advice given to you by your health care provider. Make sure you discuss any questions you have with your health care provider.   Document Released: 11/11/2000 Document Revised: 02/06/2012 Document Reviewed: 04/22/2015 Elsevier Interactive Patient Education 2016 [employer]    IF you received an x-ray today, you will receive an invoice from [facility]. Please contact [facility] at [phone] with questions or concerns regarding your invoice.   IF you received labwork today, you will receive an invoice from [facility]. Please contact [facility] at [phone] with questions or concerns regarding your invoice.   Our billing staff will not be able to assist you with questions regarding bills from these companies.  You will be contacted with the lab results as soon as they are available. The fastest way to get your results is to activate your  My Chart account. Instructions are located on the last page of this paperwork. If you have not heard from [place] regarding the results in 2 weeks, please contact this office.

## 2016-07-18 NOTE — Progress Notes (Signed)
Subjective:  By signing my name below, I, Stann Oresung-Kai Tsai, attest that this documentation has been prepared under the direction and in the presence of Meredith StaggersJeffrey Leylanie Woodmansee, MD. Electronically Signed: Stann Oresung-Kai Tsai, Scribe. 07/18/2016 , 5:54 PM .  Patient was seen in Room 10 .   Patient ID: Wayne Evesharles R Ridgely Jr., male    DOB: [birth year], 45 y.o.   MRN: [id number] Chief Complaint  Patient presents with  . Poison Ivy    bilateral forearms x 2 days    HPI Wayne EvesCharles R Wuthrich Jr. is a 45 y.o. male  Patient was trimming his hedges about a week ago. He noticed rash with bumps appear over his bilateral forearms about 2 days after. He was wearing short sleeves at the time. He's been applying calamine lotion to the affected areas. He denies any changes. He denies the rash spreading to other areas. He denies oral lesions, genital sores, trouble breathing or trouble swallowing.   Patient Active Problem List   Diagnosis Date Noted  . 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 Obesity, morbid (HCC) 04/06/2015  . OSA on CPAP 04/06/2015   Past Medical History:  Diagnosis Date  . Hypertension   . OSA (obstructive sleep apnea)    Past Surgical History:  Procedure Laterality Date  . VASECTOMY     No Known Allergies Prior to Admission medications   Medication Sig Start Date End Date Taking? Authorizing Provider  amLODipine (NORVASC) 5 MG tablet Take 5 mg by mouth daily. 03/12/15  Yes Historical Provider, MD  irbesartan (AVAPRO) 300 MG tablet Take 300 mg by mouth daily. 03/05/15  Yes Historical Provider, MD  modafinil (PROVIGIL) 200 MG tablet Take 1 tablet (200 mg total) by mouth daily. 05/04/16  Yes Carmen Dohmeier, MD  omeprazole (PRILOSEC) 20 MG capsule Take 20 mg by mouth daily.   Yes Historical Provider, MD   Social History   Social History  . Marital status: Single    Spouse name: N/A  . Number of children: N/A  . Years of education: N/A   Occupational History  . Not on file.    Social History Main Topics  . Smoking status: Never Smoker  . Smokeless tobacco: Never Used  . Alcohol use No     Comment: 1 beer a week  . Drug use: No  . Sexual activity: Not on file   Other Topics Concern  . Not on file   Social History Narrative   No caffeine consumption.   Review of Systems  Constitutional: Negative for chills, fatigue and fever.  HENT: Negative for mouth sores and trouble swallowing.   Eyes: Negative for visual disturbance.  Respiratory: Negative for cough, shortness of breath and wheezing.   Gastrointestinal: Negative for diarrhea, nausea and vomiting.  Genitourinary: Negative for genital sores.  Skin: Positive for rash.       Objective:   Physical Exam  Constitutional: He is oriented to person, place, and time. He appears well-developed and well-nourished. No distress.  HENT:  Head: Normocephalic and atraumatic.  Eyes: EOM are normal. Pupils are equal, round, and reactive to light.  Neck: Neck supple.  Cardiovascular: Normal rate.   Pulmonary/Chest: Effort normal. No respiratory distress.  Musculoskeletal: Normal range of motion.  Neurological: He is alert and oriented to person, place, and time.  Skin: Skin is warm and dry.  Right forearm: faint erythematous papules on the volar forearms Left forearm: few scattered papules on the volar forearm, dorsal forearm uninvolved  Psychiatric: He  has a normal mood and affect. His behavior is normal.  Nursing note and vitals reviewed.   Vitals:   07/18/16 1731  BP: 130/78  Pulse: 81  Resp: 16  Temp: 98.7 F (37.1 C)  TempSrc: Oral  SpO2: 98%  Weight: 290 lb 12.8 oz (131.9 kg)  Height: 6\' 1"  (1.854 m)      Assessment & Plan:    Wayne EvesCharles R Lasala Jr. is a 45 y.o. male Poison ivy dermatitis - Plan: triamcinolone cream (KENALOG) 0.1 % Localized reaction on forearms, without significant change recently. Will treat topically with triamcinolone cream, Continue Caladryl Lotion If Needed.  Claritin Over-The-Counter Daily If Needed. RTC Precautions If Spreading.  Meds ordered this encounter  Medications  . triamcinolone cream (KENALOG) 0.1 %    Sig: Apply 1 application topically 3 (three) times daily as needed. To affected areas.    Dispense:  45 g    Refill:  0   Patient Instructions    Try the steroid cream to the affected areas on her forearms 2-3 times per day until itching has improved. He can also try over-the-counter Claritin once per day if needed for itching. If any spread of rash to neck, face, or other spreading rash, return for recheck as you may need to be on prednisone at that time.   Poison [employer] Poison ivy is a inflammation of the skin (contact dermatitis) caused by touching the allergens on the leaves of the ivy plant following previous exposure to the plant. The rash usually appears 48 hours after exposure. The rash is usually bumps (papules) or blisters (vesicles) in a linear pattern. Depending on your own sensitivity, the rash may simply cause redness and itching, or it may also progress to blisters which may break open. These must be well cared for to prevent secondary bacterial (germ) infection, followed by scarring. Keep any open areas dry, clean, dressed, and covered with an antibacterial ointment if needed. The eyes may also get puffy. The puffiness is worst in the morning and gets better as the day progresses. This dermatitis usually heals without scarring, within 2 to 3 weeks without treatment. HOME CARE INSTRUCTIONS  Thoroughly wash with soap and water as soon as you have been exposed to poison ivy. You have about one half hour to remove the plant resin before it will cause the rash. This washing will destroy the oil or antigen on the skin that is causing, or will cause, the rash. Be sure to wash under your fingernails as any plant resin there will continue to spread the rash. Do not rub skin vigorously when washing affected area. Poison ivy cannot spread if  no oil from the plant remains on your body. A rash that has progressed to weeping sores will not spread the rash unless you have not washed thoroughly. It is also important to wash any clothes you have been wearing as these may carry active allergens. The rash will return if you wear the unwashed clothing, even several days later. Avoidance of the plant in the future is the best measure. Poison ivy plant can be recognized by the number of leaves. Generally, poison ivy has three leaves with flowering branches on a single stem. Diphenhydramine may be purchased over the counter and used as needed for itching. Do not drive with this medication if it makes you drowsy.Ask your caregiver about medication for children. SEEK MEDICAL CARE IF:  Open sores develop.  Redness spreads beyond area of rash.  You notice purulent (pus-like)  discharge.  You have increased pain.  Other signs of infection develop (such as fever).   This information is not intended to replace advice given to you by your health care provider. Make sure you discuss any questions you have with your health care provider.   Document Released: 11/11/2000 Document Revised: 02/06/2012 Document Reviewed: 04/22/2015 Elsevier Interactive Patient Education 2016 [employer].    IF you received an x-ray today, you will receive an invoice from [facility]. Please contact [facility] at [phone] with questions or concerns regarding your invoice.   IF you received labwork today, you will receive an invoice from [facility]. Please contact [facility] at [phone] with questions or concerns regarding your invoice.   Our billing staff will not be able to assist you with questions regarding bills from these companies.  You will be contacted with the lab results as soon as they are available. The fastest way to get your results is to activate your My Chart account. Instructions are located on the  last page of this paperwork. If you have not heard from [place] regarding the results in 2 weeks, please contact this office.        I personally performed the services described in this documentation, which was scribed in my presence. The recorded information has been reviewed and considered, and addended by me as needed.   Signed,   Meredith Staggers, MD [facility].  07/18/16 6:44 PM

## 2017-03-02 ENCOUNTER — Ambulatory Visit (INDEPENDENT_AMBULATORY_CARE_PROVIDER_SITE_OTHER): Payer: 59 | Admitting: Physician Assistant

## 2017-03-02 VITALS — BP 134/87 | HR 94 | Temp 99.1°F | Resp 18 | Ht 73.0 in | Wt 294.0 lb

## 2017-03-02 DIAGNOSIS — M545 Low back pain: Secondary | ICD-10-CM | POA: Diagnosis not present

## 2017-03-02 DIAGNOSIS — G8929 Other chronic pain: Secondary | ICD-10-CM | POA: Diagnosis not present

## 2017-03-02 DIAGNOSIS — M62838 Other muscle spasm: Secondary | ICD-10-CM

## 2017-03-02 MED ORDER — MELOXICAM 15 MG PO TABS: 15.0000 mg | ORAL_TABLET | Freq: Every day | ORAL | 1 refills | Status: DC

## 2017-03-02 MED ORDER — CYCLOBENZAPRINE HCL 10 MG PO TABS: 10.0000 mg | ORAL_TABLET | Freq: Three times a day (TID) | ORAL | 0 refills | Status: DC | PRN

## 2017-03-02 NOTE — Patient Instructions (Addendum)
PT will call you to schedule an appointment.  Please take your Flexeril as prescribed. This is a muscle relaxer. It does not interact with Mobic, which is an antiinflammatory.  Use a heating pad 20-30 min a time daily. See below stretches.   Thank you for coming in today. I hope you feel we met your needs.  Feel free to call [facility] if you have any questions or further requests.  Please consider signing up for MyChart if you do not already have it, as this is a great way to communicate with me.  Best,  Whitney Golden Gilreath, PA-C   Low Back Sprain Rehab Ask your health care provider which exercises are safe for you. Do exercises exactly as told by your health care provider and adjust them as directed. It is normal to feel mild stretching, pulling, tightness, or discomfort as you do these exercises, but you should stop right away if you feel sudden pain or your pain gets worse. Do not begin these exercises until told by your health care provider. Stretching and range of motion exercises These exercises warm up your muscles and joints and improve the movement and flexibility of your back. These exercises also help to relieve pain, numbness, and tingling. Exercise A: Lumbar rotation   1. Lie on your back on a firm surface and bend your knees. 2. Straighten your arms out to your sides so each arm forms an "L" shape with a side of your body (a 90 degree angle). 3. Slowly move both of your knees to one side of your body until you feel a stretch in your lower back. Try not to let your shoulders move off of the floor. 4. Hold for __________ seconds. 5. Tense your abdominal muscles and slowly move your knees back to the starting position. 6. Repeat this exercise on the other side of your body. Repeat __________ times. Complete this exercise __________ times a day. Exercise B: Prone extension on elbows   1. Lie on your abdomen on a firm surface. 2. Prop yourself up on your elbows. 3. Use your arms to help  lift your chest up until you feel a gentle stretch in your abdomen and your lower back.  This will place some of your body weight on your elbows. If this is uncomfortable, try stacking pillows under your chest.  Your hips should stay down, against the surface that you are lying on. Keep your hip and back muscles relaxed. 4. Hold for __________ seconds. 5. Slowly relax your upper body and return to the starting position. Repeat __________ times. Complete this exercise __________ times a day. Strengthening exercises These exercises build strength and endurance in your back. Endurance is the ability to use your muscles for a long time, even after they get tired. Exercise C: Pelvic tilt  1. Lie on your back on a firm surface. Bend your knees and keep your feet flat. 2. Tense your abdominal muscles. Tip your pelvis up toward the ceiling and flatten your lower back into the floor.  To help with this exercise, you may place a small towel under your lower back and try to push your back into the towel. 3. Hold for __________ seconds. 4. Let your muscles relax completely before you repeat this exercise. Repeat __________ times. Complete this exercise __________ times a day. Exercise D: Alternating arm and leg raises   1. Get on your hands and knees on a firm surface. If you are on a hard floor, you may want to  use padding to cushion your knees, such as an exercise mat. 2. Line up your arms and legs. Your hands should be below your shoulders, and your knees should be below your hips. 3. Lift your left leg behind you. At the same time, raise your right arm and straighten it in front of you.  Do not lift your leg higher than your hip.  Do not lift your arm higher than your shoulder.  Keep your abdominal and back muscles tight.  Keep your hips facing the ground.  Do not arch your back.  Keep your balance carefully, and do not hold your breath. 4. Hold for __________ seconds. 5. Slowly return to  the starting position and repeat with your right leg and your left arm. Repeat __________ times. Complete this exercise __________ times a day. Exercise E: Abdominal set with straight leg raise   1. Lie on your back on a firm surface. 2. Bend one of your knees and keep your other leg straight. 3. Tense your abdominal muscles and lift your straight leg up, 4-6 inches (10-15 cm) off the ground. 4. Keep your abdominal muscles tight and hold for __________ seconds.  Do not hold your breath.  Do not arch your back. Keep it flat against the ground. 5. Keep your abdominal muscles tense as you slowly lower your leg back to the starting position. 6. Repeat with your other leg. Repeat __________ times. Complete this exercise __________ times a day.  Low Back Strain Rehab Ask your health care provider which exercises are safe for you. Do exercises exactly as told by your health care provider and adjust them as directed. It is normal to feel mild stretching, pulling, tightness, or discomfort as you do these exercises, but you should stop right away if you feel sudden pain or your pain gets worse. Do not begin these exercises until told by your health care provider. Stretching and range of motion exercises These exercises warm up your muscles and joints and improve the movement and flexibility of your back. These exercises also help to relieve pain, numbness, and tingling. Exercise A: Single knee to chest   1. Lie on your back on a firm surface with both legs straight. 2. Bend one of your knees. Use your hands to move your knee up toward your chest until you feel a gentle stretch in your lower back and buttock.  Hold your leg in this position by holding onto the front of your knee.  Keep your other leg as straight as possible. 3. Hold for __________ seconds. 4. Slowly return to the starting position. 5. Repeat with your other leg. Repeat __________ times. Complete this exercise __________ times a  day. Exercise B: Prone extension on elbows   6. Lie on your abdomen on a firm surface. 7. Prop yourself up on your elbows. 8. Use your arms to help lift your chest up until you feel a gentle stretch in your abdomen and your lower back.  This will place some of your body weight on your elbows. If this is uncomfortable, try stacking pillows under your chest.  Your hips should stay down, against the surface that you are lying on. Keep your hip and back muscles relaxed. 9. Hold for __________ seconds. 10. Slowly relax your upper body and return to the starting position. Repeat __________ times. Complete this exercise __________ times a day. Strengthening exercises These exercises build strength and endurance in your back. Endurance is the ability to use your muscles for a  long time, even after they get tired. Exercise C: Pelvic tilt  5. Lie on your back on a firm surface. Bend your knees and keep your feet flat. 6. Tense your abdominal muscles. Tip your pelvis up toward the ceiling and flatten your lower back into the floor.  To help with this exercise, you may place a small towel under your lower back and try to push your back into the towel. 7. Hold for __________ seconds. 8. Let your muscles relax completely before you repeat this exercise. Repeat __________ times. Complete this exercise __________ times a day. Exercise J: Single leg lower with bent knees  7. Lie on your back on a firm surface. 8. Tense your abdominal muscles and lift your feet off the floor, one foot at a time, so your knees and hips are bent in an "L" shape (at about 90 degrees).  Your knees should be over your hips and your lower legs should be parallel to the floor. 9. Keeping your abdominal muscles tense and your knee bent, slowly lower one of your legs so your toe touches the ground. 10. Lift your leg back up to return to the starting position.  Do not hold your breath.  Do not let your back arch. Keep your back  flat against the ground. 11. Repeat with your other leg. Repeat __________ times. Complete this exercise __________ times a day. Posture and body mechanics   Body mechanics refers to the movements and positions of your body while you do your daily activities. Posture is part of body mechanics. Good posture and healthy body mechanics can help to relieve stress in your body's tissues and joints. Good posture means that your spine is in its natural S-curve position (your spine is neutral), your shoulders are pulled back slightly, and your head is not tipped forward. The following are general guidelines for applying improved posture and body mechanics to your everyday activities. Standing    When standing, keep your spine neutral and your feet about hip-width apart. Keep a slight bend in your knees. Your ears, shoulders, and hips should line up.  When you do a task in which you stand in one place for a long time, place one foot up on a stable object that is 2-4 inches (5-10 cm) high, such as a footstool. This helps keep your spine neutral. Sitting    When sitting, keep your spine neutral and keep your feet flat on the floor. Use a footrest, if necessary, and keep your thighs parallel to the floor. Avoid rounding your shoulders, and avoid tilting your head forward.  When working at a desk or a computer, keep your desk at a height where your hands are slightly lower than your elbows. Slide your chair under your desk so you are close enough to maintain good posture.  When working at a computer, place your monitor at a height where you are looking straight ahead and you do not have to tilt your head forward or downward to look at the screen. Resting    When lying down and resting, avoid positions that are most painful for you.  If you have pain with activities such as sitting, bending, stooping, or squatting (flexion-based activities), lie in a position in which your body does not bend very much.  For example, avoid curling up on your side with your arms and knees near your chest (fetal position).  If you have pain with activities such as standing for a long time or reaching with  your arms (extension-based activities), lie with your spine in a neutral position and bend your knees slightly. Try the following positions:  Lying on your side with a pillow between your knees.  Lying on your back with a pillow under your knees. Lifting    When lifting objects, keep your feet at least shoulder-width apart and tighten your abdominal muscles.  Bend your knees and hips and keep your spine neutral. It is important to lift using the strength of your legs, not your back. Do not lock your knees straight out.  Always ask for help to lift heavy or awkward objects. This information is not intended to replace advice given to you by your health care provider. Make sure you discuss any questions you have with your health care provider. Document Released: 11/14/2005 Document Revised: 07/21/2016 Document Reviewed: 08/26/2015 Elsevier Interactive Patient Education  2017 [employer].

## 2017-03-02 NOTE — Progress Notes (Signed)
Wayne Nelson.  MRN: [id number] DOB: [birth year]  PCP: Rolly Pancake, MD  Subjective:  Pt is a 46 year old male who presents to clinic for back pain x several months. pain has been there for years, however has progressed over the last few months, which is concerning to him.   Pain starts in mid lower back, radiated to b/l hips, then up back.  Back pain happens daily.  Walking makes it worse - hurts worse the longer he is moving. Leaning forard on a counter makes it better.  He works as Photographer - walks a lot.  Works out a gym several times a week. He says the row machine feels good when he bends forward. He does not work out his core muscles on a regular basis. Does not do cardio.  He has tried good supportive shoes.  Has not taken anyting for this. No stretching or heat.  Denies n/t in extremities, pain along his spine, numbness in saddle region, loss of bowel or bladder function. Admits to 20lb weight gain over the past year or so.   Review of Systems  Musculoskeletal: Positive for back pain.    Patient Active Problem List   Diagnosis Date Noted  . 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 Obesity, morbid (HCC) 04/06/2015  . OSA on CPAP 04/06/2015    Current Outpatient Prescriptions on File Prior to Visit  Medication Sig Dispense Refill  . amLODipine (NORVASC) 5 MG tablet Take 5 mg by mouth daily.  9  . irbesartan (AVAPRO) 300 MG tablet Take 300 mg by mouth daily.  12  . modafinil (PROVIGIL) 200 MG tablet Take 1 tablet (200 mg total) by mouth daily. 30 tablet 3  . omeprazole (PRILOSEC) 20 MG capsule Take 20 mg by mouth daily.    Marland Kitchen triamcinolone cream (KENALOG) 0.1 % Apply 1 application topically 3 (three) times daily as needed. To affected areas. 45 g 0   No current facility-administered medications on file prior to visit.     No Known Allergies   Objective:  BP (!) 149/83   Pulse 94   Temp 99.1 F (37.3 C) (Oral)   Resp 18   Ht   (1.854 m)   Wt 294 lb (133.4 kg)   SpO2 94%   BMI 38.79 kg/m   Physical Exam  Constitutional: He is oriented to person, place, and time and well-developed, well-nourished, and in no distress. No distress.  Cardiovascular: Normal rate, regular rhythm and normal heart sounds.   Musculoskeletal:       Lumbar back: He exhibits tenderness and spasm. He exhibits normal range of motion, no bony tenderness and no deformity.  Neurological: He is alert and oriented to person, place, and time. He has normal motor skills, normal sensation and normal strength. He has a normal Straight Leg Raise Test. Gait normal. GCS score is 15.  Skin: Skin is warm and dry.  Psychiatric: Mood, memory, affect and judgment normal.  Vitals reviewed.   Assessment and Plan :  1. Muscle spasm 2. Chronic bilateral low back pain without sciatica 3. Obesity, morbid (HCC) - Ambulatory referral to Physical Therapy - cyclobenzaprine (FLEXERIL) 10 MG tablet; Take 1 tablet (10 mg total) by mouth 3 (three) times daily as needed for muscle spasms.  Dispense: 45 tablet; Refill: 0 - meloxicam (MOBIC) 15 MG tablet; Take 1 tablet (15 mg total) by mouth daily.  Dispense: 30 tablet; Refill: 1 - Pain is musculoskeletal in nature. Encouraged  weight loss, stretches, heat, hydration. Discussed proper body mechanics and core strengthening. Stretches printed out for pt. Will refer to physical therapy as this has been ongoing problem for the pt. RTC if symptoms worsen.    [name], PA-C  Primary Care at [facility] 03/02/2017 11:59 AM

## 2017-05-04 ENCOUNTER — Encounter: Payer: Self-pay | Admitting: Adult Health

## 2017-05-04 ENCOUNTER — Encounter (INDEPENDENT_AMBULATORY_CARE_PROVIDER_SITE_OTHER): Payer: Self-pay

## 2017-05-04 ENCOUNTER — Ambulatory Visit (INDEPENDENT_AMBULATORY_CARE_PROVIDER_SITE_OTHER): Payer: 59 | Admitting: Adult Health

## 2017-05-04 VITALS — BP 130/82 | HR 86 | Ht 73.0 in | Wt 301.8 lb

## 2017-05-04 DIAGNOSIS — Z9989 Dependence on other enabling machines and devices: Secondary | ICD-10-CM | POA: Diagnosis not present

## 2017-05-04 DIAGNOSIS — G4733 Obstructive sleep apnea (adult) (pediatric): Secondary | ICD-10-CM | POA: Diagnosis not present

## 2017-05-04 NOTE — Progress Notes (Signed)
I agree with the assessment and plan as directed by NP .The patient is known to me .   Talik Casique, MD  

## 2017-05-04 NOTE — Progress Notes (Signed)
PATIENT: Wayne Nelson. DOB: Sep 27, 1971  REASON FOR VISIT: follow up- OSA on CPAP  HISTORY FROM: patient  HISTORY OF PRESENT ILLNESS: Mr. Vondrak is a 46 year old male with a history of obstructive sleep apnea on CPAP. He returns today for follow-up. His CPAP download indicates that he uses machine 30 out of 30 days for compliance of 100%. He uses machine greater than 4 hours 26 out of 30 days for compliance of 87%. On average he uses machine 5 hours and 28 minutes. His residual AHI is 4.1 on 11 cm water with EPR 2. He does not have a significant leak. He does state at night he feels that there is air leaking from his machine but not the mask. He plans on stopping by his DME company today to have them look at it. The patient may qualify for a new machine. If he does I will  provide him with a new prescription. He returns today for an evaluation.  HISTORY Copied from Dr. Oliva Bustard notes 05/04/16: Interval history from 05/04/2016. I meeting was office [name] today for his revisit after CPAP titration this is his yearly visit. 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 He has been highly compliant with CPAP use. 100% of days and 87% of those over 4 hours of nightly use. The machine recorded in average of 5 hours and 28 minutes of use at 11 cm water pressure with 2 cm EPR. The residual AHI is 4.1 all residual apneas are obstructive in nature but he does have some significant air leakage and on some nights I believe this artificially increases his AHI. He also reports that he takes a while to wind down at night before he is able to go to sleep and takes a few mindful breathing exercises. He has used an FX nasal pillow and this dislodges very easily. His fatigue severity score was endorsed today at 29 and his Epworth sleepiness score at 15 points, it is the Epworth sleepiness score that concerns me. I would like for him to be refitted for a different  interface and he is interested in the dream wear mask by [employer]. I will ask his DME to provide him some arthritis to the current model. He is using an extended holes. With the dream ware mask he may be happy a lower risk of entanglling himself. He has noted some libido issues, and these may be related to fatigue. He does not have a history of spinal cord injury, multiple sclerosis, bowel or bladder dysfunction.   REVIEW OF SYSTEMS: Out of a complete 14 system review of symptoms, the patient complains only of the following symptoms, and all other reviewed systems are negative.  See history of present illness  ALLERGIES: No Known Allergies  HOME MEDICATIONS: Outpatient Medications Prior to Visit  Medication Sig Dispense Refill  . omeprazole (PRILOSEC) 20 MG capsule Take 20 mg by mouth daily.    Marland Kitchen amLODipine (NORVASC) 5 MG tablet Take 5 mg by mouth daily.  9  . cyclobenzaprine (FLEXERIL) 10 MG tablet Take 1 tablet (10 mg total) by mouth 3 (three) times daily as needed for muscle spasms. 45 tablet 0  . irbesartan (AVAPRO) 300 MG tablet Take 300 mg by mouth daily.  12  . meloxicam (MOBIC) 15 MG tablet Take 1 tablet (15 mg total) by mouth daily. 30 tablet 1  . modafinil (PROVIGIL) 200 MG tablet Take 1 tablet (200 mg  total) by mouth daily. 30 tablet 3  . triamcinolone cream (KENALOG) 0.1 % Apply 1 application topically 3 (three) times daily as needed. To affected areas. 45 g 0   No facility-administered medications prior to visit.     PAST MEDICAL HISTORY: Past Medical History:  Diagnosis Date  . Hypertension   . OSA (obstructive sleep apnea)     PAST SURGICAL HISTORY: Past Surgical History:  Procedure Laterality Date  . VASECTOMY      FAMILY HISTORY: Family History  Problem Relation Age of Onset  . Hypertension Father     SOCIAL HISTORY: Social History   Social History  . Marital status: Single    Spouse name: N/A  . Number of children: N/A  . Years of  education: N/A   Occupational History  . Not on file.   Social History Main Topics  . Smoking status: Never Smoker  . Smokeless tobacco: Never Used  . Alcohol use No     Comment: 1 beer a week  . Drug use: No  . Sexual activity: Not on file   Other Topics Concern  . Not on file   Social History Narrative   No caffeine consumption.      PHYSICAL EXAM  Vitals:   05/04/17 0749  BP: 130/82  Pulse: 86  Weight: (!) 301 lb 12.8 oz (136.9 kg)  Height: 6\' 1"  (1.854 m)   Body mass index is 39.82 kg/m.  Generalized: Well developed, in no acute distress   Neurological examination  Mentation: Alert oriented to time, place, history taking. Follows all commands speech and language fluent Cranial nerve II-XII: Pupils were equal round reactive to light. Extraocular movements were full, visual field were full on confrontational test. Facial sensation and strength were normal. Uvula tongue midline. Head turning and shoulder shrug  were normal and symmetric. Neck circumference 21 inches. Motor: The motor testing reveals 5 over 5 strength of all 4 extremities. Good symmetric motor tone is noted throughout.  Sensory: Sensory testing is intact to soft touch on all 4 extremities. No evidence of extinction is noted.  Coordination: Cerebellar testing reveals good finger-nose-finger and heel-to-shin bilaterally.  Gait and station: Gait is normal.   DIAGNOSTIC DATA (LABS, IMAGING, TESTING) - I reviewed patient records, labs, notes, testing and imaging myself where available.    ASSESSMENT AND PLAN 46 y.o. year old male  has a past medical history of Hypertension and OSA (obstructive sleep apnea). here with:  1. Obstructive sleep apnea on CPAP.  Overall the patient's download shows excellent compliance. He is encouraged to continue using his CPAP nightly. He will stop by his DME company to have them look at his machine. He is advised that if his symptoms worsen or he develops new symptoms he  should let us know. He will follow-up in one year or sooner if needed.  I spent 15 minutes with the patient. 50% of this time was spent reviewing the patient's CPAP download.       Butch PennyMegan Merriel Zinger, MSN, NP-C 05/04/2017, 7:54 AM [facility] [address], [address] [place], [place] [address] [phone]

## 2017-05-04 NOTE — Patient Instructions (Signed)
Continue using CPAP nightly If your symptoms worsen or you develop new symptoms please let us know.   

## 2017-05-19 ENCOUNTER — Other Ambulatory Visit: Payer: Self-pay | Admitting: Physician Assistant

## 2017-05-19 DIAGNOSIS — M62838 Other muscle spasm: Secondary | ICD-10-CM

## 2017-09-20 ENCOUNTER — Encounter: Payer: Self-pay | Admitting: Gastroenterology

## 2017-11-02 ENCOUNTER — Telehealth: Payer: Self-pay

## 2017-11-02 NOTE — Telephone Encounter (Signed)
Patient no showed for Pre-Visit. A message was left on his cell phone to call and reschedule his a PV. Patient was informed that if we don't hear from him by 5:00 Pm today we will cancel his colonoscopy that is scheduled for 11/16/17. If patient does not call a no show letter will be mailed today.    Janalee DaneNancy Kinan Safley, LPN ( PV )

## 2017-11-03 ENCOUNTER — Encounter: Payer: Self-pay | Admitting: Internal Medicine

## 2017-11-16 ENCOUNTER — Encounter: Payer: Self-pay | Admitting: Gastroenterology

## 2018-05-05 ENCOUNTER — Encounter: Payer: Self-pay | Admitting: Family Medicine

## 2018-05-05 ENCOUNTER — Other Ambulatory Visit: Payer: Self-pay

## 2018-05-05 ENCOUNTER — Ambulatory Visit (INDEPENDENT_AMBULATORY_CARE_PROVIDER_SITE_OTHER): Payer: 59 | Admitting: Family Medicine

## 2018-05-05 VITALS — BP 136/82 | HR 86 | Temp 99.0°F | Resp 18 | Ht 73.0 in | Wt 301.6 lb

## 2018-05-05 DIAGNOSIS — R058 Other specified cough: Secondary | ICD-10-CM

## 2018-05-05 DIAGNOSIS — R05 Cough: Secondary | ICD-10-CM

## 2018-05-05 MED ORDER — PREDNISONE 20 MG PO TABS: ORAL_TABLET | ORAL | 0 refills | Status: DC

## 2018-05-05 MED ORDER — BENZONATATE 100 MG PO CAPS: 100.0000 mg | ORAL_CAPSULE | Freq: Three times a day (TID) | ORAL | 0 refills | Status: DC | PRN

## 2018-05-05 MED ORDER — AMOXICILLIN 875 MG PO TABS: 875.0000 mg | ORAL_TABLET | Freq: Two times a day (BID) | ORAL | 0 refills | Status: DC

## 2018-05-05 NOTE — Patient Instructions (Addendum)
Drink plenty of fluids to help keep yourself well-hydrated which thins any secretions.  Take the prednisone 20 mg 3 pills daily for 2 days, then 2 daily for 2 days, then 1 daily for 2 days.  These are best taken after breakfast.  Take the benzonatate cough pills 1 to 2 pills 3 times daily as needed for cough suppression  In addition can take over-the-counter Mucinex DM if needed for the cough.  Take amoxicillin 875 twice daily for infection  Return if worse or not improving.    IF you received an x-ray today, you will receive an invoice from [facility]. Please contact [facility] at [phone] with questions or concerns regarding your invoice.   IF you received labwork today, you will receive an invoice from [facility]. Please contact [facility] at [phone] with questions or concerns regarding your invoice.   Our billing staff will not be able to assist you with questions regarding bills from these companies.  You will be contacted with the lab results as soon as they are available. The fastest way to get your results is to activate your My Chart account. Instructions are located on the last page of this paperwork. If you have not heard from us regarding the results in 2 weeks, please contact this office.

## 2018-05-05 NOTE — Progress Notes (Signed)
Patient ID: Wayne Eves., male    DOB: 1971-07-07  Age: 47 y.o. MRN: [id number]  Chief Complaint  Patient presents with  . Cough    had a cold couple weeks ago and cough never went away  dry cough     Subjective:   47 year old man who has had a respiratory tract infection and cough for the past 2 weeks.  The cough continues to linger on.  He gets a little nasal congestion, but primarily the dry cough.  He is tried some OTC medications without relief.  He does not smoke except for may be a cigar a week or so.  He works a job out in Scientific laboratory technician.  He does not have a history of walk a lot of respiratory tract infections in the past.  No one with him or around him is had a similar problem.  He is just not been able to throw this.  He is not excessively short of breath.  Has been able to continue working out some.  Current allergies, medications, problem list, past/family and social histories reviewed.  Objective:  BP 136/82   Pulse 86   Temp 99 F (37.2 C) (Oral)   Resp 18   Ht 6\' 1"  (1.854 m)   Wt (!) 301 lb 9.6 oz (136.8 kg)   SpO2 97%   BMI 39.79 kg/m   No major acute distress.  Large man.  TMs normal.  Throat clear.  Neck supple without nodes.  Chest is clear to auscultation.  However on forced expiration there is a little bit of a prolonged expiratory phase and it triggered some coughing.  Heart was regular without murmurs, gallops, or arrhythmias.  Assessment & Plan:   Assessment: 1. Post-viral cough syndrome       Plan: See instructions.  We will treat this as a post viral cough syndrome.  No orders of the defined types were placed in this encounter.   Meds ordered this encounter  Medications  . predniSONE (DELTASONE) 20 MG tablet    Sig: Take 3 daily for 2 days, then 2 daily for 2 days, then 1 daily for 2 days for cough    Dispense:  12 tablet    Refill:  0  . amoxicillin (AMOXIL) 875 MG tablet    Sig: Take 1 tablet (875 mg total) by mouth 2 (two) times daily.     Dispense:  14 tablet    Refill:  0  . benzonatate (TESSALON) 100 MG capsule    Sig: Take 1-2 capsules (100-200 mg total) by mouth 3 (three) times daily as needed for cough.    Dispense:  40 capsule    Refill:  0         Patient Instructions   Drink plenty of fluids to help keep yourself well-hydrated which thins any secretions.  Take the prednisone 20 mg 3 pills daily for 2 days, then 2 daily for 2 days, then 1 daily for 2 days.  These are best taken after breakfast.  Take the benzonatate cough pills 1 to 2 pills 3 times daily as needed for cough suppression  In addition can take over-the-counter Mucinex DM if needed for the cough.  Take amoxicillin 875 twice daily for infection  Return if worse or not improving.    IF you received an x-ray today, you will receive an invoice from [facility]. Please contact [facility] at [phone] with questions or concerns regarding your invoice.   IF you received  labwork today, you will receive an invoice from [facility]. Please contact [facility] at [phone] with questions or concerns regarding your invoice.   Our billing staff will not be able to assist you with questions regarding bills from these companies.  You will be contacted with the lab results as soon as they are available. The fastest way to get your results is to activate your My Chart account. Instructions are located on the last page of this paperwork. If you have not heard from us regarding the results in 2 weeks, please contact this office.        No follow-ups on file.   Janace Hoardavid Hopper, MD 05/05/2018

## 2018-05-07 ENCOUNTER — Encounter: Payer: Self-pay | Admitting: Adult Health

## 2018-05-07 ENCOUNTER — Telehealth: Payer: Self-pay | Admitting: *Deleted

## 2018-05-07 ENCOUNTER — Ambulatory Visit (INDEPENDENT_AMBULATORY_CARE_PROVIDER_SITE_OTHER): Payer: 59 | Admitting: Adult Health

## 2018-05-07 VITALS — BP 148/88 | HR 86 | Ht 73.0 in | Wt 300.8 lb

## 2018-05-07 DIAGNOSIS — Z9989 Dependence on other enabling machines and devices: Secondary | ICD-10-CM | POA: Diagnosis not present

## 2018-05-07 DIAGNOSIS — G4733 Obstructive sleep apnea (adult) (pediatric): Secondary | ICD-10-CM | POA: Diagnosis not present

## 2018-05-07 NOTE — Progress Notes (Signed)
[facility] and [facility] pt has older cpap machine,  No information since 05-05-17.  He would need to call and make appt.  I spoke to pt and relayed this and gave [facility].  He verbalized understanding.

## 2018-05-07 NOTE — Telephone Encounter (Signed)
Dois DavenportSandra  This order has been pulled and sent into the office. Thanks.   Angie   Previous Messages    ----- Message -----  From: Guy BeginYoung, Raydan Schlabach S, RN  Sent: 05/07/2018 10:00 AM  To: Vanessa BarbaraAngela Trotter   GM, this pt has new cpap orders. Thanks, His DOB is [birth year]. Delmer IslamSandy RN At [facility]

## 2018-05-07 NOTE — Progress Notes (Signed)
PATIENT: Wayne Nelson. DOB: 12/20/1970  REASON FOR VISIT: follow up HISTORY FROM: patient  HISTORY OF PRESENT ILLNESS: Today 05/07/18:  Wayne Nelson is a 47 year old male with a history of obstructive sleep apnea on CPAP.  He returns today for follow-up.  The patient forgot to bring his CPAP machine.  He also states that his machine does not have a memory card.  He does confirm that he uses machine every single night.  He denies any significant issues with the machine.  He states that he continues to work well for him.  He returns today for an evaluation.   HISTORY Wayne Nelson is a 47 year old male with a history of obstructive sleep apnea on CPAP. He returns today for follow-up. His CPAP download indicates that he uses machine 30 out of 30 days for compliance of 100%. He uses machine greater than 4 hours 26 out of 30 days for compliance of 87%. On average he uses machine 5 hours and 28 minutes. His residual AHI is 4.1 on 11 cm water with EPR 2. He does not have a significant leak. He does state at night he feels that there is air leaking from his machine but not the mask. He plans on stopping by his DME company today to have them look at it. The patient may qualify for a new machine. If he does I will  provide him with a new prescription. He returns today for an evaluation.   REVIEW OF SYSTEMS: Out of a complete 14 system review of symptoms, the patient complains only of the following symptoms, and all other reviewed systems are negative.  See HPI  ALLERGIES: No Known Allergies  HOME MEDICATIONS: Outpatient Medications Prior to Visit  Medication Sig Dispense Refill  . amLODipine (NORVASC) 5 MG tablet Take 5 mg by mouth daily.  2  . gabapentin (NEURONTIN) 300 MG capsule Take 300 mg by mouth.     . losartan-hydrochlorothiazide (HYZAAR) 100-12.5 MG tablet Take 1 tablet by mouth daily.  2  . meloxicam (MOBIC) 15 MG tablet Take 15 mg by mouth as needed.  1  . methylphenidate  (RITALIN) 20 MG tablet TAKE 1 TABLET BY MOUTH IN THE MORNING  0  . omeprazole (PRILOSEC) 20 MG capsule Take 20 mg by mouth daily.    Marland Kitchen amoxicillin (AMOXIL) 875 MG tablet Take 1 tablet (875 mg total) by mouth 2 (two) times daily. (Patient not taking: Reported on 05/07/2018) 14 tablet 0  . benzonatate (TESSALON) 100 MG capsule Take 1-2 capsules (100-200 mg total) by mouth 3 (three) times daily as needed for cough. (Patient not taking: Reported on 05/07/2018) 40 capsule 0  . cyclobenzaprine (FLEXERIL) 10 MG tablet Take 10 mg by mouth as needed.  0  . predniSONE (DELTASONE) 20 MG tablet Take 3 daily for 2 days, then 2 daily for 2 days, then 1 daily for 2 days for cough (Patient not taking: Reported on 05/07/2018) 12 tablet 0   No facility-administered medications prior to visit.     PAST MEDICAL HISTORY: Past Medical History:  Diagnosis Date  . Hypertension   . OSA (obstructive sleep apnea)     PAST SURGICAL HISTORY: Past Surgical History:  Procedure Laterality Date  . VASECTOMY      FAMILY HISTORY: Family History  Problem Relation Age of Onset  . Hypertension Father     SOCIAL HISTORY: Social History   Socioeconomic History  . Marital status: Single    Spouse name: Not on file  .  Number of children: Not on file  . Years of education: Not on file  . Highest education level: Not on file  Occupational History  . Not on file  Social Needs  . Financial resource strain: Not on file  . Food insecurity:    Worry: Not on file    Inability: Not on file  . Transportation needs:    Medical: Not on file    Non-medical: Not on file  Tobacco Use  . Smoking status: Light Tobacco Smoker    Types: Cigars  . Smokeless tobacco: Never Used  . Tobacco comment: smoke once a month  Substance and Sexual Activity  . Alcohol use: No    Alcohol/week: 0.0 oz    Comment: 1 beer a week  . Drug use: No  . Sexual activity: Not on file  Lifestyle  . Physical activity:    Days per week: Not on  file    Minutes per session: Not on file  . Stress: Not on file  Relationships  . Social connections:    Talks on phone: Not on file    Gets together: Not on file    Attends religious service: Not on file    Active member of club or organization: Not on file    Attends meetings of clubs or organizations: Not on file    Relationship status: Not on file  . Intimate partner violence:    Fear of current or ex partner: Not on file    Emotionally abused: Not on file    Physically abused: Not on file    Forced sexual activity: Not on file  Other Topics Concern  . Not on file  Social History Narrative   No caffeine consumption.      PHYSICAL EXAM  Vitals:   05/07/18 0742  BP: (!) 148/88  Pulse: 86  Weight: (!) 300 lb 12.8 oz (136.4 kg)  Height: 6\' 1"  (1.854 m)   Body mass index is 39.69 kg/m.  Generalized: Well developed, in no acute distress   Neurological examination  Mentation: Alert oriented to time, place, history taking. Follows all commands speech and language fluent Cranial nerve II-XII: Pupils were equal round reactive to light. Extraocular movements were full, visual field were full on confrontational test. Facial sensation and strength were normal. Uvula tongue midline. Head turning and shoulder shrug  were normal and symmetric. Motor: The motor testing reveals 5 over 5 strength of all 4 extremities. Good symmetric motor tone is noted throughout.  Sensory: Sensory testing is intact to soft touch on all 4 extremities. No evidence of extinction is noted.  Coordination: Cerebellar testing reveals good finger-nose-finger and heel-to-shin bilaterally.  Gait and station: Gait is normal. Tandem gait is normal. Romberg is negative. No drift is seen.  Reflexes: Deep tendon reflexes are symmetric and normal bilaterally.   DIAGNOSTIC DATA (LABS, IMAGING, TESTING) - I reviewed patient records, labs, notes, testing and imaging myself where available.     ASSESSMENT AND  PLAN 47 y.o. year old male  has a past medical history of Hypertension and OSA (obstructive sleep apnea). here with:  1.  Obstructive sleep apnea on CPAP  Wayne Nelson is advised that he should speak to his DME company about getting a memory card.  I will send an order over for new supplies and a memory card.  We will reach out to his DME company today to see if we can get a download.  Patient voiced understanding.  He will follow-up in  6 months or sooner if needed.  I spent 15 minutes with the patient. 50% of this time was spent reviewing his CPAP download   Butch PennyMegan Charli Halle, MSN, NP-C 05/07/2018, 10:18 AM [facility] [address], [address] [place], [place] [address] [phone]

## 2018-05-07 NOTE — Patient Instructions (Addendum)
Your Plan:  Continue using CPAP nightly  Order sent: for new supplies, new memory card If your symptoms worsen or you develop new symptoms please let us know.   Please call [facility] at [phone], [phone] to discuss your concerns. Their customer service representatives will be glad to assist you. If they do not answer, please leave a message and they will call you back. Make sure to leave your name and return phone number. If you do not get a response from them in 3 business days, please let our office know.    Thank you for coming to see us at [facility]. I hope we have been able to provide you high quality care today.  You may receive a patient satisfaction survey over the next few weeks. We would appreciate your feedback and comments so that we may continue to improve ourselves and the health of our patients.

## 2018-05-18 NOTE — Progress Notes (Signed)
I agree with the assessment and plan as directed by NP .The patient is known to me .   Latesia Norrington, MD  

## 2018-05-19 ENCOUNTER — Emergency Department (HOSPITAL_COMMUNITY): Payer: 59

## 2018-05-19 ENCOUNTER — Emergency Department (HOSPITAL_COMMUNITY)
Admission: EM | Admit: 2018-05-19 | Discharge: 2018-05-19 | Disposition: A | Discharge status: Home / Self Care | Payer: 59 | Attending: Emergency Medicine | Admitting: Emergency Medicine

## 2018-05-19 ENCOUNTER — Encounter (HOSPITAL_COMMUNITY): Payer: Self-pay | Admitting: Emergency Medicine

## 2018-05-19 DIAGNOSIS — F17290 Nicotine dependence, other tobacco product, uncomplicated: Secondary | ICD-10-CM | POA: Diagnosis not present

## 2018-05-19 DIAGNOSIS — Z79899 Other long term (current) drug therapy: Secondary | ICD-10-CM | POA: Diagnosis not present

## 2018-05-19 DIAGNOSIS — R05 Cough: Secondary | ICD-10-CM | POA: Diagnosis present

## 2018-05-19 DIAGNOSIS — J40 Bronchitis, not specified as acute or chronic: Secondary | ICD-10-CM | POA: Insufficient documentation

## 2018-05-19 DIAGNOSIS — I10 Essential (primary) hypertension: Secondary | ICD-10-CM | POA: Insufficient documentation

## 2018-05-19 MED ORDER — BENZONATATE 100 MG PO CAPS: 100.0000 mg | ORAL_CAPSULE | Freq: Three times a day (TID) | ORAL | 0 refills | Status: DC

## 2018-05-19 MED ORDER — PREDNISONE 50 MG PO TABS: 50.0000 mg | ORAL_TABLET | Freq: Every day | ORAL | 0 refills | Status: AC

## 2018-05-19 MED ORDER — ALBUTEROL SULFATE HFA 108 (90 BASE) MCG/ACT IN AERS
1.0000 | INHALATION_SPRAY | Freq: Once | RESPIRATORY_TRACT | Status: AC
  Administered 2018-05-19: 1 via RESPIRATORY_TRACT
  Filled 2018-05-19: qty 6.7

## 2018-05-19 MED ORDER — PROMETHAZINE-DM 6.25-15 MG/5ML PO SYRP: 5.0000 mL | ORAL_SOLUTION | Freq: Four times a day (QID) | ORAL | 0 refills | Status: DC | PRN

## 2018-05-19 MED ORDER — AEROCHAMBER PLUS FLO-VU MEDIUM MISC
1.0000 | Freq: Once | Status: AC
  Administered 2018-05-19: 1

## 2018-05-19 NOTE — ED Notes (Signed)
Bed: WTR7 Expected date:  Expected time:  Means of arrival:  Comments: 

## 2018-05-19 NOTE — Discharge Instructions (Signed)
Use inhaler every 4-6 hours for the next 2 days. Then only use as needed.  Take prednisone as prescribed.  Use cough drop and syrup as needed.  Make sure you stay well hydrated. Avoid thing that may irritate your lungs or make you cough.  Follow up with your primary care doctor if your symptoms are not improving.  Return to the ER if you develop chest pain, difficulty breathing, high fevers, or any new or concerning symptoms,.

## 2018-05-19 NOTE — ED Provider Notes (Signed)
[facility]-EMERGENCY DEPT Provider Note   CSN: [id number] Arrival date & time: 05/19/18  1122     History   Chief Complaint Chief Complaint  Patient presents with  . Cough    HPI Wayne EvesCharles R Tercero Jr. is a 47 y.o. male presenting for evaluation of cough.  Patient states the past 2 to 3 weeks, he has been having a persistent cough.  His symptoms initially began with a cold, including nasal congestion and sore throat.  Everything has resolved except his cough.  Cough is nonproductive, worse at night.  He denies history of similar.  He was seen in urgent care 2 weeks ago, prescribed Amoxil, prednisone, and Tessalon Perles.  He had mild improvement of his symptoms, but cough has persisted.  NyQuil improves his symptoms, but he does not want to take it forever.  He does not smoke cigarettes, denies vaping, cigar use, or marijuana use.  He denies a history of asthma or COPD.  He has OSA for which he uses CPAP.  He has a history of high blood pressure and gerd, which is controlled with medication.  No other medical problems.  HPI  Past Medical History:  Diagnosis Date  . Hypertension   . OSA (obstructive sleep apnea)     Patient Active Problem List   Diagnosis Date Noted  . 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 Obesity, morbid (HCC) 04/06/2015  . OSA on CPAP 04/06/2015    Past Surgical History:  Procedure Laterality Date  . VASECTOMY          Home Medications    Prior to Admission medications   Medication Sig Start Date End Date Taking? Authorizing Provider  amLODipine (NORVASC) 5 MG tablet Take 5 mg by mouth daily. 04/23/17   [provider]  benzonatate (TESSALON) 100 MG capsule Take 1 capsule (100 mg total) by mouth every 8 (eight) hours. 05/19/18   Shey Bartmess, PA-C  gabapentin (NEURONTIN) 300 MG capsule Take 300 mg by mouth.  05/03/18   [provider]  losartan-hydrochlorothiazide (HYZAAR) 100-12.5 MG tablet  Take 1 tablet by mouth daily. 04/23/17   [provider]  meloxicam (MOBIC) 15 MG tablet Take 15 mg by mouth as needed. 04/12/17   [provider]  methylphenidate (RITALIN) 20 MG tablet TAKE 1 TABLET BY MOUTH IN THE MORNING 02/10/17   [provider]  omeprazole (PRILOSEC) 20 MG capsule Take 20 mg by mouth daily.    [provider]  predniSONE (DELTASONE) 50 MG tablet Take 1 tablet (50 mg total) by mouth daily for 5 days. 05/19/18 05/24/18  Graeme Menees, PA-C  promethazine-dextromethorphan (PROMETHAZINE-DM) 6.25-15 MG/5ML syrup Take 5 mLs by mouth 4 (four) times daily as needed for cough. 05/19/18   Kem Parcher, PA-C    Family History Family History  Problem Relation Age of Onset  . Hypertension Father     Social History Social History   Tobacco Use  . Smoking status: Light Tobacco Smoker    Types: Cigars  . Smokeless tobacco: Never Used  . Tobacco comment: smoke once a month  Substance Use Topics  . Alcohol use: No    Alcohol/week: 0.0 oz    Comment: 1 beer a week  . Drug use: No     Allergies   Patient has no known allergies.   Review of Systems Review of Systems  Constitutional: Negative for fever.  HENT: Negative for congestion, rhinorrhea and sore throat.   Respiratory: Positive for  cough. Negative for shortness of breath and wheezing.   Cardiovascular: Negative for chest pain.     Physical Exam Updated Vital Signs BP (!) 141/87 (BP Location: Left Arm)   Pulse 86   Temp 98.3 F (36.8 C) (Oral)   Resp 18   SpO2 98%   Physical Exam  Constitutional: He is oriented to person, place, and time. He appears well-developed and well-nourished. No distress.  Appears nontoxic  HENT:  Head: Normocephalic and atraumatic.  Right Ear: Tympanic membrane, external ear and ear canal normal.  Left Ear: Tympanic membrane, external ear and ear canal normal.  Nose: Mucosal edema present. Right sinus exhibits no maxillary sinus  tenderness and no frontal sinus tenderness. Left sinus exhibits no maxillary sinus tenderness and no frontal sinus tenderness.  Mouth/Throat: Uvula is midline, oropharynx is clear and moist and mucous membranes are normal. No tonsillar exudate.  OP clear without tonsillar swelling or exudate.  Uvula midline with equal palate rise.  TMs nonerythematous and not bulging bilaterally.  No nasal mucosal edema.  Eyes: Pupils are equal, round, and reactive to light. Conjunctivae and EOM are normal.  Neck: Normal range of motion.  Cardiovascular: Normal rate, regular rhythm and intact distal pulses.  Pulmonary/Chest: Effort normal and breath sounds normal. He has no decreased breath sounds. He has no wheezes. He has no rhonchi. He has no rales.  Pt speaking in full sentences without difficulty.  Clear lung sounds in all fields.  Patient with dry cough throughout exam.  Abdominal: Soft. He exhibits no distension. There is no tenderness.  Musculoskeletal: Normal range of motion.  Lymphadenopathy:    He has no cervical adenopathy.  Neurological: He is alert and oriented to person, place, and time.  Skin: Skin is warm.  Psychiatric: He has a normal mood and affect.  Nursing note and vitals reviewed.    ED Treatments / Results  Labs (all labs ordered are listed, but only abnormal results are displayed) Labs Reviewed - No data to display  EKG None  Radiology Dg Chest 2 View  Result Date: 05/19/2018 CLINICAL DATA:  Nonproductive cough for the past 3 weeks. EXAM: CHEST - 2 VIEW COMPARISON:  Chest x-ray dated February 02, 2005. FINDINGS: The heart size and mediastinal contours are within normal limits. Both lungs are clear. The visualized skeletal structures are unremarkable. IMPRESSION: No active cardiopulmonary disease. Electronically Signed   By: Obie Dredge M.D.   On: 05/19/2018 12:06    Procedures Procedures (including critical care time)  Medications Ordered in ED Medications  albuterol  (PROVENTIL HFA;VENTOLIN HFA) 108 (90 Base) MCG/ACT inhaler 1 puff (1 puff Inhalation Given 05/19/18 1241)  AEROCHAMBER PLUS FLO-VU MEDIUM MISC 1 each (1 each Other Given 05/19/18 1241)     Initial Impression / Assessment and Plan / ED Course  I have reviewed the triage vital signs and the nursing notes.  Pertinent labs & imaging results that were available during my care of the patient were reviewed by me and considered in my medical decision making (see chart for details).     Patient presenting for evaluation of continued cough.  Physical exam reassuring, he is afebrile not tachycardic.  Appears nontoxic.  Pulmonary exam reassuring, doubt pneumonia.  X-ray viewed interpreted by me, no pneumonia, pneumothorax, effusions, or cardiomegaly.  As patient reports initial viral syndrome which resolved except for cough, likely bronchitis.  Discussed with patient.  Doubt PE, no tachycardia, pulse ox normal, no chest pain or shortness of breath.  Doubt ACS  or cardiac cause.  Doubt GERD as source of symptoms, patient without other heartburn symptoms and has been taking GERD medication as prescribed without change.  Discussed importance of cough suppression.  Patient given inhaler and spacer.  Will discharge with prednisone, Tessalon Perles, cough syrup.  Follow-up with primary care as needed.  Discussed avoiding lung irritants including smoke.  At this time, patient appears safe for discharge.  Return precautions given.  Patient states he understands and agrees to plan.  Final Clinical Impressions(s) / ED Diagnoses   Final diagnoses:  Bronchitis    ED Discharge Orders        Ordered    predniSONE (DELTASONE) 50 MG tablet  Daily     05/19/18 1230    benzonatate (TESSALON) 100 MG capsule  Every 8 hours     05/19/18 1230    promethazine-dextromethorphan (PROMETHAZINE-DM) 6.25-15 MG/5ML syrup  4 times daily PRN     05/19/18 1230       Edmon Magid, PA-C 05/19/18 1308    Jacalyn Lefevre,  MD 05/19/18 1515

## 2018-05-19 NOTE — ED Triage Notes (Signed)
Pt reports he was seen at [facility] 3 weeks ago for his cough and given prescription but not helping.

## 2018-06-11 ENCOUNTER — Telehealth: Payer: Self-pay | Admitting: Adult Health

## 2018-06-11 NOTE — Telephone Encounter (Signed)
Pt request refill for methylphenidate (RITALIN) 20 MG tablet sent to [facility]

## 2018-06-11 NOTE — Telephone Encounter (Signed)
Per [place] narcotic registry, he has been getting his Ritalin refills from Dr. Jarome Matinaniel Paterson.  I spoke to the patient and he will contact Dr. Eloise HarmanPaterson for this refill.

## 2018-07-10 NOTE — Progress Notes (Signed)
I agree with the assessment and plan as directed by NP .The patient is known to me .   Geordan Xu, MD  

## 2018-09-23 IMAGING — CR DG CHEST 2V
2 series · 2 of 2 positions shown · non-contrast
Comparison: Chest x-ray dated February 02, 2005.

CLINICAL DATA: Nonproductive cough for the past 3 weeks.

EXAM:
CHEST - 2 VIEW

[w chest pa]
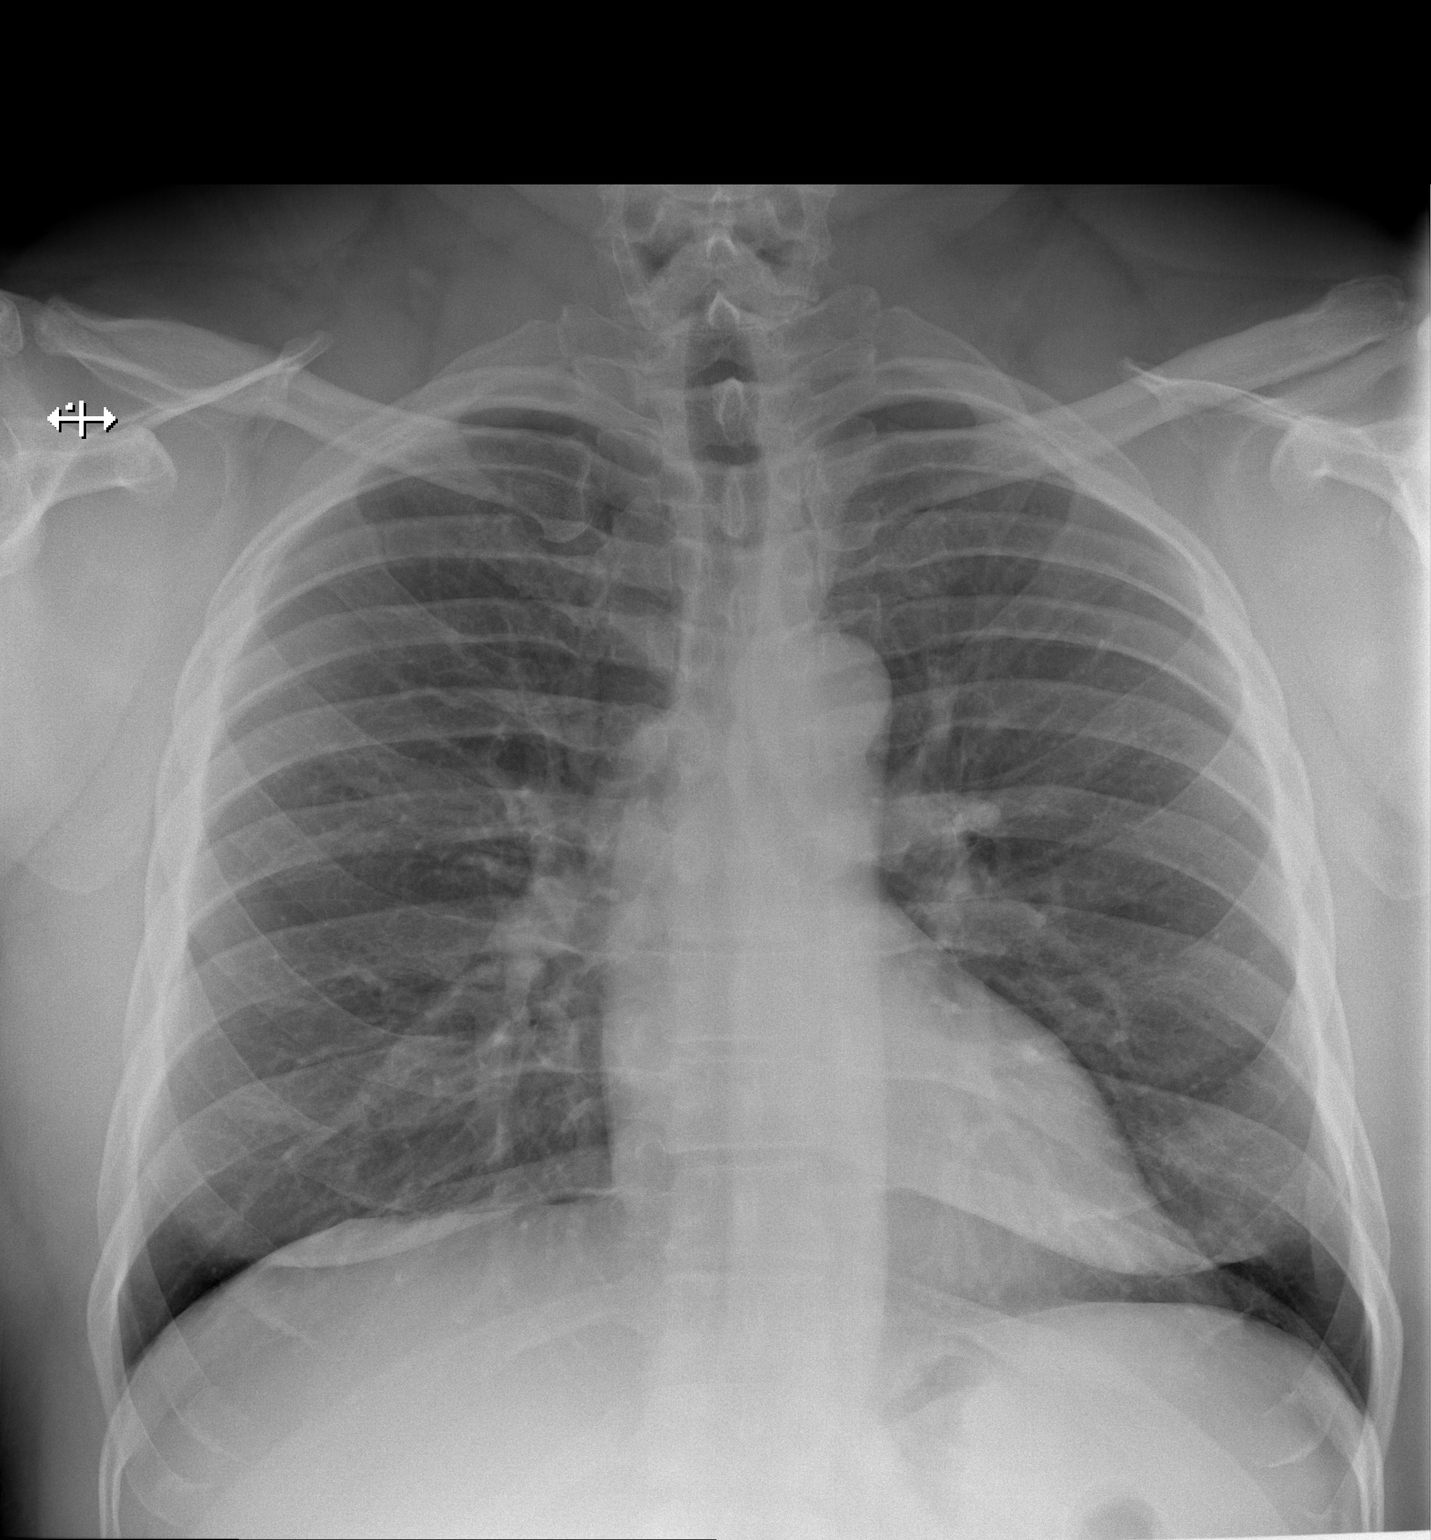

[w chest lat]
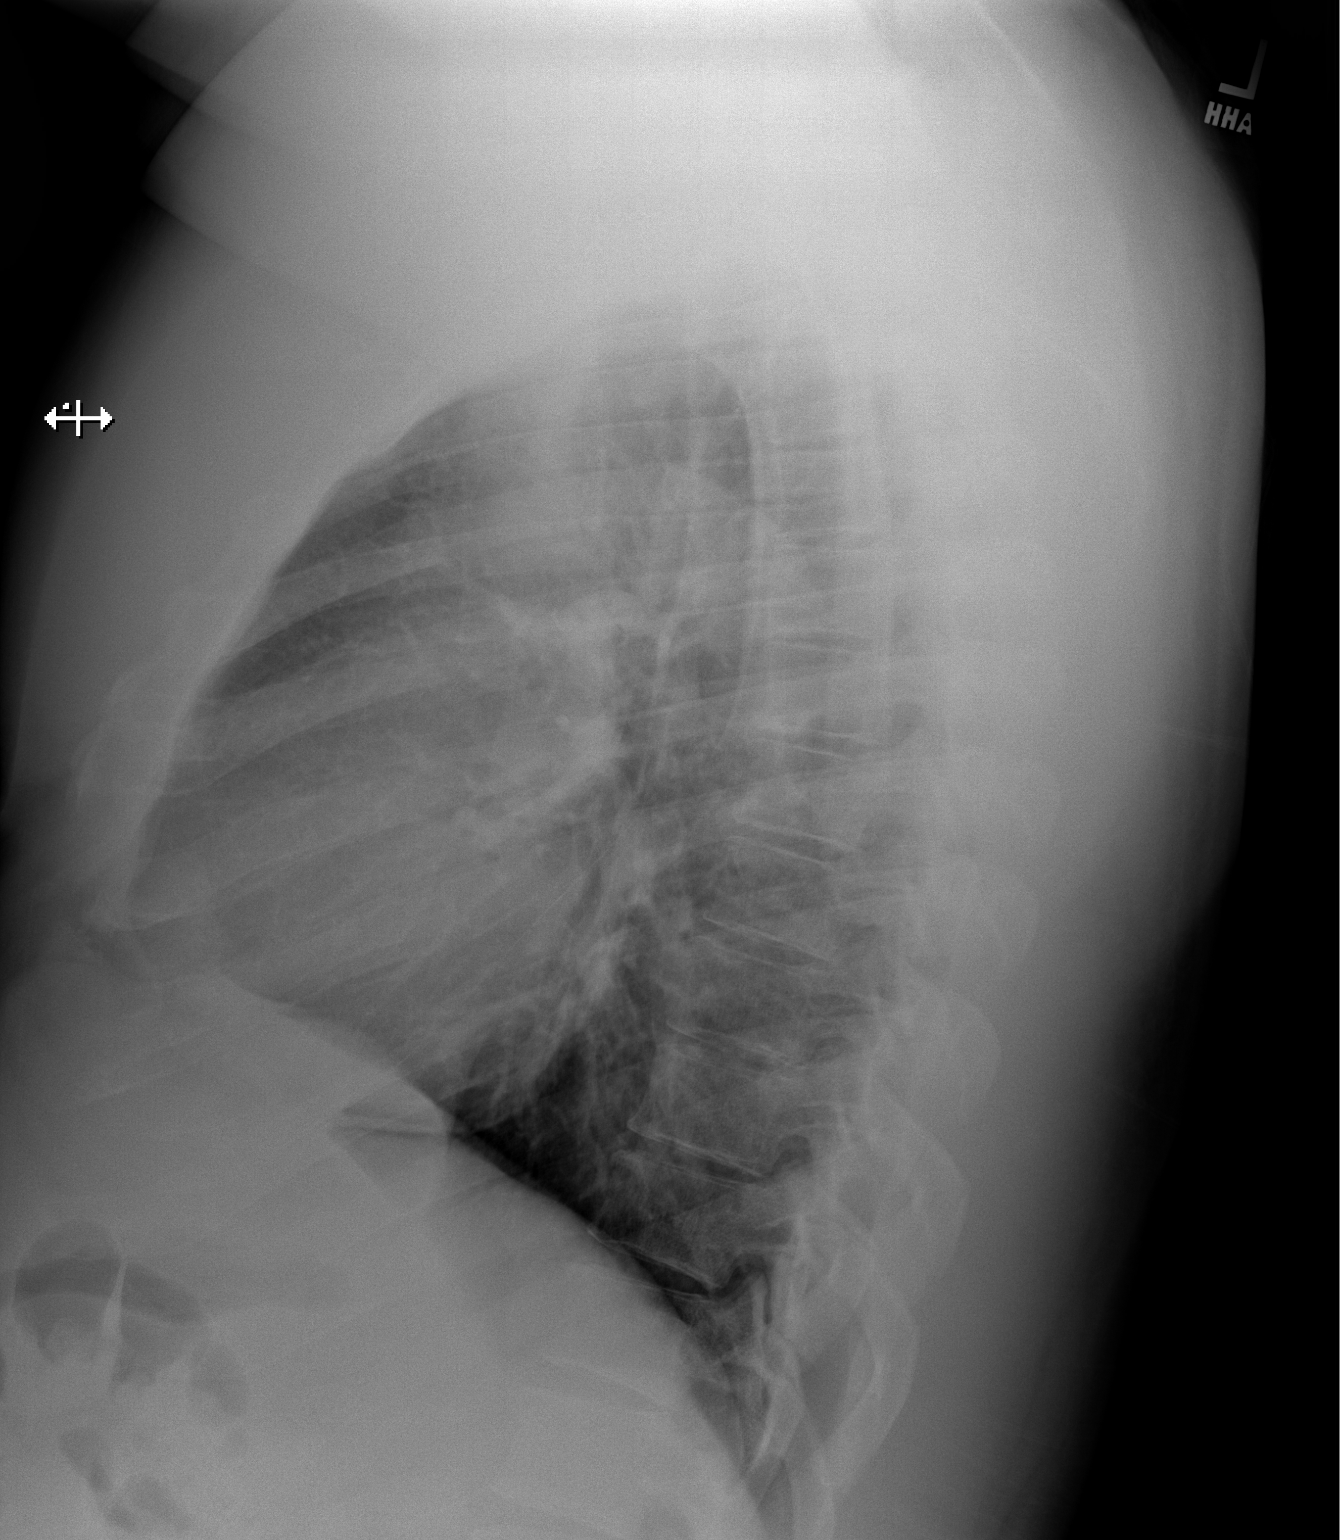

[2 of 2 positions shown; findings below may reference images not displayed]

FINDINGS: The heart size and mediastinal contours are within normal limits.
Both lungs are clear. The visualized skeletal structures are
unremarkable.
IMPRESSION: No active cardiopulmonary disease.

## 2018-10-06 ENCOUNTER — Ambulatory Visit: Payer: 59 | Admitting: Family Medicine

## 2019-02-12 DIAGNOSIS — M545 Low back pain: Secondary | ICD-10-CM | POA: Diagnosis not present

## 2019-02-12 DIAGNOSIS — Z1331 Encounter for screening for depression: Secondary | ICD-10-CM | POA: Diagnosis not present

## 2019-02-12 DIAGNOSIS — G4733 Obstructive sleep apnea (adult) (pediatric): Secondary | ICD-10-CM | POA: Diagnosis not present

## 2019-02-12 DIAGNOSIS — I10 Essential (primary) hypertension: Secondary | ICD-10-CM | POA: Diagnosis not present

## 2019-02-12 DIAGNOSIS — K219 Gastro-esophageal reflux disease without esophagitis: Secondary | ICD-10-CM | POA: Diagnosis not present

## 2019-02-12 DIAGNOSIS — Z0000 Encounter for general adult medical examination without abnormal findings: Secondary | ICD-10-CM | POA: Diagnosis not present

## 2019-03-06 DIAGNOSIS — I10 Essential (primary) hypertension: Secondary | ICD-10-CM | POA: Diagnosis not present

## 2019-03-06 DIAGNOSIS — E785 Hyperlipidemia, unspecified: Secondary | ICD-10-CM | POA: Diagnosis not present

## 2019-03-06 DIAGNOSIS — G4733 Obstructive sleep apnea (adult) (pediatric): Secondary | ICD-10-CM | POA: Diagnosis not present

## 2019-05-08 ENCOUNTER — Telehealth: Payer: Self-pay | Admitting: *Deleted

## 2019-05-08 NOTE — Telephone Encounter (Signed)
Due to current COVID 19 pandemic, our office is severely reducing in office visits until further notice, in order to minimize the risk to our patients and healthcare providers. Unable to get in contact with the patient to convert their office visit with Megan on 05/13/2019 into a doxy.me visit. I left a voicemail asking the patient to return my call. Office number was provided.     If patient calls back please convert their office visit into a doxy.me visit.       

## 2019-05-09 NOTE — Telephone Encounter (Signed)
LMVM again to return call.

## 2019-05-12 ENCOUNTER — Encounter: Payer: Self-pay | Admitting: Adult Health

## 2019-05-13 ENCOUNTER — Encounter: Payer: Self-pay | Admitting: Adult Health

## 2019-05-13 ENCOUNTER — Ambulatory Visit (INDEPENDENT_AMBULATORY_CARE_PROVIDER_SITE_OTHER): Payer: BLUE CROSS/BLUE SHIELD | Admitting: Adult Health

## 2019-05-13 ENCOUNTER — Ambulatory Visit: Payer: 59 | Admitting: Adult Health

## 2019-05-13 ENCOUNTER — Other Ambulatory Visit: Payer: Self-pay

## 2019-05-13 VITALS — BP 142/84 | HR 84 | Temp 96.2°F | Ht 73.0 in | Wt 306.0 lb

## 2019-05-13 DIAGNOSIS — G4733 Obstructive sleep apnea (adult) (pediatric): Secondary | ICD-10-CM

## 2019-05-13 DIAGNOSIS — Z9989 Dependence on other enabling machines and devices: Secondary | ICD-10-CM | POA: Diagnosis not present

## 2019-05-13 NOTE — Progress Notes (Signed)
PATIENT: Wayne Evesharles R Tomkinson Jr. DOB: 1971/01/10  REASON FOR VISIT: follow up HISTORY FROM: patient  HISTORY OF PRESENT ILLNESS: Today 05/13/19 :  Mr. Derrell Lollingngram is a 48 year old male with a history of obstructive sleep apnea on CPAP.  He returns today for follow-up.  His CPAP download indicates that he uses machine 29 out of 30 days for compliance of 97%.  He uses machine greater than 4 hours 22 out of 30 days for compliance of 73%.  On average he uses his machine 5 hours and 4 minutes.  His residual AHI is 3.1 on 11 cm of water with EPR 3.  He has a leak but percentile is 21.3 L/min.  He states that he is due for new supplies.  He reports that his straps and the water chamber are broken.  He states that [facility] and plans to pay that today.   HISTORY 05/07/18:  Mr. Derrell Lollingngram is a 48 year old male with a history of obstructive sleep apnea on CPAP.  He returns today for follow-up.  The patient forgot to bring his CPAP machine.  He also states that his machine does not have a memory card.  He does confirm that he uses machine every single night.  He denies any significant issues with the machine.  He states that he continues to work well for him.  He returns today for an evaluation  REVIEW OF SYSTEMS: Out of a complete 14 system review of symptoms, the patient complains only of the following symptoms, and all other reviewed systems are negative.  See HPI  ALLERGIES: No Known Allergies  HOME MEDICATIONS: Outpatient Medications Prior to Visit  Medication Sig Dispense Refill  . amLODipine (NORVASC) 5 MG tablet Take 5 mg by mouth daily.  2  . benzonatate (TESSALON) 100 MG capsule Take 1 capsule (100 mg total) by mouth every 8 (eight) hours. 21 capsule 0  . gabapentin (NEURONTIN) 300 MG capsule Take 300 mg by mouth.     . losartan-hydrochlorothiazide (HYZAAR) 100-12.5 MG tablet Take 1 tablet by mouth daily.  2  . meloxicam (MOBIC) 15 MG tablet Take 15 mg by mouth  as needed.  1  . methylphenidate (RITALIN) 20 MG tablet TAKE 1 TABLET BY MOUTH IN THE MORNING  0  . omeprazole (PRILOSEC) 20 MG capsule Take 20 mg by mouth daily.    . promethazine-dextromethorphan (PROMETHAZINE-DM) 6.25-15 MG/5ML syrup Take 5 mLs by mouth 4 (four) times daily as needed for cough. 118 mL 0   No facility-administered medications prior to visit.     PAST MEDICAL HISTORY: Past Medical History:  Diagnosis Date  . Hypertension   . OSA (obstructive sleep apnea)     PAST SURGICAL HISTORY: Past Surgical History:  Procedure Laterality Date  . VASECTOMY      FAMILY HISTORY: Family History  Problem Relation Age of Onset  . Hypertension Father     SOCIAL HISTORY: Social History   Socioeconomic History  . Marital status: Single    Spouse name: Not on file  . Number of children: Not on file  . Years of education: Not on file  . Highest education level: Not on file  Occupational History  . Not on file  Social Needs  . Financial resource strain: Not on file  . Food insecurity    Worry: Not on file    Inability: Not on file  . Transportation needs    Medical: Not on file  Non-medical: Not on file  Tobacco Use  . Smoking status: Light Tobacco Smoker    Types: Cigars  . Smokeless tobacco: Never Used  . Tobacco comment: smoke once a month  Substance and Sexual Activity  . Alcohol use: No    Alcohol/week: 0.0 standard drinks    Comment: 1 beer a week  . Drug use: No  . Sexual activity: Not on file  Lifestyle  . Physical activity    Days per week: Not on file    Minutes per session: Not on file  . Stress: Not on file  Relationships  . Social Herbalist on phone: Not on file    Gets together: Not on file    Attends religious service: Not on file    Active member of club or organization: Not on file    Attends meetings of clubs or organizations: Not on file    Relationship status: Not on file  . Intimate partner violence    Fear of current  or ex partner: Not on file    Emotionally abused: Not on file    Physically abused: Not on file    Forced sexual activity: Not on file  Other Topics Concern  . Not on file  Social History Narrative   No caffeine consumption.      PHYSICAL EXAM  Vitals:   05/13/19 1431  BP: (!) 142/84  Pulse: 84  Temp: (!) 96.2 F (35.7 C)  Weight: (!) 306 lb (138.8 kg)  Height: 6\' 1"  (1.854 m)   Body mass index is 40.37 kg/m.  Generalized: Well developed, in no acute distress  Chest: Lungs clear to auscultation bilaterally  Neurological examination  Mentation: Alert oriented to time, place, history taking. Follows all commands speech and language fluent Cranial nerve II-XII: Pupils were equal round reactive to light. Extraocular movements were full, visual field were full on confrontational test. Facial sensation and strength were normal. Uvula tongue midline. Head turning and shoulder shrug  were normal and symmetric. Motor: The motor testing reveals 5 over 5 strength of all 4 extremities. Good symmetric motor tone is noted throughout.  Sensory: Sensory testing is intact to soft touch on all 4 extremities. No evidence of extinction is noted.  Coordination: Cerebellar testing reveals good finger-nose-finger and heel-to-shin bilaterally.  Gait and station: Gait is normal.    DIAGNOSTIC DATA (LABS, IMAGING, TESTING) - I reviewed patient records, labs, notes, testing and imaging myself where available.     ASSESSMENT AND PLAN 48 y.o. year old male  has a past medical history of Hypertension and OSA (obstructive sleep apnea). here with:  1.  Obstructive sleep apnea on CPAP  The patient CPAP download shows excellent compliance and good treatment of his apnea.  He isencouraged to continue using his CPAP nightly and greater than 4 hours each night.  He was given a mask today.  He is advised that if his symptoms worsen or he develops new symptoms he should let us know.  He will follow-up in 1  year or sooner if needed.  I spent 15 minutes with the patient. 50% of this time was spent reviewing his CPAP download   Ward Givens, MSN, NP-C 05/13/2019, 1:44 PM [facility] [address] [place], [place] [address] [phone]

## 2019-05-13 NOTE — Patient Instructions (Signed)
Your Plan:  Continue using CPAP nightly  Please call [facility] at [phone], extension [phone] to discuss your concerns. Their customer service representatives will be glad to assist you. If they do not answer, please leave a message and they will call you back. Make sure to leave your name and return phone number. If you do not get a response from them in 3 business days, please let our office know.   Thank you for coming to see [place] at [facility]. I hope we have been able to provide you high quality care today.  You may receive a patient satisfaction survey over the next few weeks. We would appreciate your feedback and comments so that we may continue to improve ourselves and the health of our patients.

## 2019-05-14 NOTE — Progress Notes (Signed)
CMM sent and received by Cunningham. sy

## 2019-06-13 DIAGNOSIS — D485 Neoplasm of uncertain behavior of skin: Secondary | ICD-10-CM | POA: Diagnosis not present

## 2019-06-13 DIAGNOSIS — L089 Local infection of the skin and subcutaneous tissue, unspecified: Secondary | ICD-10-CM | POA: Diagnosis not present

## 2019-06-13 DIAGNOSIS — D2330 Other benign neoplasm of skin of unspecified part of face: Secondary | ICD-10-CM | POA: Diagnosis not present

## 2019-07-02 DIAGNOSIS — G4733 Obstructive sleep apnea (adult) (pediatric): Secondary | ICD-10-CM | POA: Diagnosis not present

## 2019-07-02 DIAGNOSIS — J0100 Acute maxillary sinusitis, unspecified: Secondary | ICD-10-CM | POA: Diagnosis not present

## 2019-07-02 DIAGNOSIS — R05 Cough: Secondary | ICD-10-CM | POA: Diagnosis not present

## 2019-07-18 DIAGNOSIS — D239 Other benign neoplasm of skin, unspecified: Secondary | ICD-10-CM | POA: Diagnosis not present

## 2019-07-18 DIAGNOSIS — D489 Neoplasm of uncertain behavior, unspecified: Secondary | ICD-10-CM | POA: Diagnosis not present

## 2019-09-10 DIAGNOSIS — M25522 Pain in left elbow: Secondary | ICD-10-CM | POA: Diagnosis not present

## 2019-09-10 DIAGNOSIS — M545 Low back pain: Secondary | ICD-10-CM | POA: Diagnosis not present

## 2019-09-10 DIAGNOSIS — G4733 Obstructive sleep apnea (adult) (pediatric): Secondary | ICD-10-CM | POA: Diagnosis not present

## 2019-09-10 DIAGNOSIS — I10 Essential (primary) hypertension: Secondary | ICD-10-CM | POA: Diagnosis not present

## 2020-03-05 ENCOUNTER — Ambulatory Visit: Payer: Self-pay | Attending: Internal Medicine

## 2020-03-05 DIAGNOSIS — Z23 Encounter for immunization: Secondary | ICD-10-CM

## 2020-03-05 NOTE — Progress Notes (Signed)
   [id number]  Name:  Wayne Nelson.    MRN: [id number] DOB: 1971-08-11  03/05/2020  Mr. [name] was observed post Covid-19 immunization for 15 minutes without incident. He was provided with Vaccine Information Sheet and instruction to access the V-Safe system.   Mr. [name] was instructed to call 911 with any severe reactions post vaccine: Marland Kitchen Difficulty breathing  . Swelling of face and throat  . A fast heartbeat  . A bad rash all over body  . Dizziness and weakness   Immunizations Administered    Name Date Dose VIS Date Route   Pfizer COVID-19 Vaccine 03/05/2020  9:30 AM 0.3 mL 11/08/2019 Intramuscular   Manufacturer: [employer], [employer]   Lot: [id number]   NDC: [address]-1

## 2020-03-30 ENCOUNTER — Ambulatory Visit: Payer: Self-pay | Attending: Internal Medicine

## 2020-03-30 DIAGNOSIS — Z23 Encounter for immunization: Secondary | ICD-10-CM

## 2020-03-30 NOTE — Progress Notes (Signed)
   [id number]  Name:  Wayne Nelson.    MRN: [id number] DOB: 01-14-1971  03/30/2020  Wayne Nelson was observed post Covid-19 immunization for 15 minutes without incident. He was provided with Vaccine Information Sheet and instruction to access the V-Safe system.   Wayne Nelson was instructed to call 911 with any severe reactions post vaccine: Marland Kitchen Difficulty breathing  . Swelling of face and throat  . A fast heartbeat  . A bad rash all over body  . Dizziness and weakness   Immunizations Administered    Name Date Dose VIS Date Route   Pfizer COVID-19 Vaccine 03/30/2020  8:43 AM 0.3 mL 01/22/2019 Intramuscular   Manufacturer: [employer], [employer]   Lot: [id number]   NDC: [address]-8

## 2020-05-13 ENCOUNTER — Ambulatory Visit: Payer: Self-pay | Admitting: Adult Health

## 2020-06-02 ENCOUNTER — Telehealth: Payer: Self-pay | Admitting: Adult Health

## 2020-06-02 NOTE — Telephone Encounter (Signed)
I had called patient to see if he had a SD card in his CPAP machine since his information is not updated in [employer]. He stated that he has recently started a new job and it is very difficult for him to get off from work in the middle of the day. His appointment is currently scheduled for tomorrow 06/03/20 at 930am. He wanted to know if Aundra Millet had any slots open at 330pm. I advised him that she does not.   He wants to know Aundra Millet would recommend for him.   Aundra Millet, can you please advise? Thanks!

## 2020-06-03 ENCOUNTER — Encounter: Payer: Self-pay | Admitting: Adult Health

## 2020-06-03 ENCOUNTER — Ambulatory Visit: Payer: Self-pay | Admitting: Adult Health

## 2020-06-04 NOTE — Telephone Encounter (Signed)
You can offer a mychart visit

## 2020-08-25 ENCOUNTER — Ambulatory Visit
Admission: EM | Admit: 2020-08-25 | Discharge: 2020-08-25 | Disposition: A | Discharge status: Home / Self Care | Payer: BLUE CROSS/BLUE SHIELD

## 2020-08-25 ENCOUNTER — Other Ambulatory Visit: Payer: Self-pay

## 2020-08-25 DIAGNOSIS — L84 Corns and callosities: Secondary | ICD-10-CM

## 2020-08-25 DIAGNOSIS — T162XXA Foreign body in left ear, initial encounter: Secondary | ICD-10-CM | POA: Diagnosis not present

## 2020-08-25 NOTE — ED Triage Notes (Signed)
Pt c/o lt ear pain x3 days, states hears a noise in it. Pt also c/o a tender spot to side of lt foot/pinky toe. States tender when walking.

## 2020-08-25 NOTE — Discharge Instructions (Signed)
Foreign body ear Removed. No other foreign body noticed.  Corn of foot Over the counter salicylic acid with donut dressing to relief pressure. Follow up with PCP/dermatology for removal

## 2020-08-25 NOTE — ED Provider Notes (Signed)
[facility]    CSN: [id number] Arrival date & time: 08/25/20  1600      History   Chief Complaint Chief Complaint  Patient presents with  . Otalgia    HPI Aldo Sondgeroth. is a 49 y.o. male.   49 year old male comes in for multiple complaints  1. Left ear pain x 3 days. Has been hearing noises to the ear. Denies muffled hearing, ear drainage. Denies URI symptoms, fevers. Denies recent swimming  2. Left toe pain. States has callus to the left 5th toe with tenderness when walking. Denies injury.     Past Medical History:  Diagnosis Date  . Hypertension   . OSA (obstructive sleep apnea)     Patient Active Problem List   Diagnosis Date Noted  . 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 Obesity, morbid (HCC) 04/06/2015  . OSA on CPAP 04/06/2015    Past Surgical History:  Procedure Laterality Date  . VASECTOMY         Home Medications    Prior to Admission medications   Medication Sig Start Date End Date Taking? Authorizing Provider  amLODipine (NORVASC) 5 MG tablet Take 5 mg by mouth daily. 04/23/17   [provider]  gabapentin (NEURONTIN) 300 MG capsule Take 300 mg by mouth.  05/03/18   [provider]  losartan-hydrochlorothiazide (HYZAAR) 100-12.5 MG tablet Take 1 tablet by mouth daily. 04/23/17   [provider]  meloxicam (MOBIC) 15 MG tablet Take 15 mg by mouth as needed. 04/12/17   [provider]  methylphenidate (RITALIN) 20 MG tablet TAKE 1 TABLET BY MOUTH IN THE MORNING 02/10/17   [provider]  omeprazole (PRILOSEC) 20 MG capsule Take 20 mg by mouth daily.    [provider]    Family History Family History  Problem Relation Age of Onset  . Hypertension Father     Social History Social History   Tobacco Use  . Smoking status: Light Tobacco Smoker    Types: Cigars  . Smokeless tobacco: Never Used  . Tobacco comment: smoke once a month  Vaping Use  .  Vaping Use: Never used  Substance Use Topics  . Alcohol use: No    Alcohol/week: 0.0 standard drinks    Comment: 1 beer a week  . Drug use: No     Allergies   Patient has no known allergies.   Review of Systems Review of Systems  Reason unable to perform ROS: See HPI as above.     Physical Exam Triage Vital Signs ED Triage Vitals  Enc Vitals Group     BP 08/25/20 1804 (!) 152/78     Pulse Rate 08/25/20 1804 92     Resp 08/25/20 1804 18     Temp 08/25/20 1804 98.5 F (36.9 C)     Temp Source 08/25/20 1804 Oral     SpO2 08/25/20 1804 96 %     Weight --      Height --      Head Circumference --      Peak Flow --      Pain Score 08/25/20 1817 0     Pain Loc --      Pain Edu? --      Excl. in GC? --    No data found.  Updated Vital Signs BP (!) 152/78 (BP Location: Left Arm)   Pulse 92   Temp 98.5 F (36.9 C) (Oral)   Resp  18   SpO2 96%   Visual Acuity Right Eye Distance:   Left Eye Distance:   Bilateral Distance:    Right Eye Near:   Left Eye Near:    Bilateral Near:     Physical Exam Constitutional:      General: He is not in acute distress.    Appearance: Normal appearance. He is well-developed. He is not toxic-appearing or diaphoretic.  HENT:     Head: Normocephalic and atraumatic.     Right Ear: Tympanic membrane, ear canal and external ear normal.     Ears:     Comments: Left tragus without tenderness. Ear canal without swelling, erythema. Bug noted in ear canal. TM visible without erythema, bulging.   Foreign body removed with ear irrigation. Bug intact, no other foreign body seen. See picture below.  Eyes:     Conjunctiva/sclera: Conjunctivae normal.     Pupils: Pupils are equal, round, and reactive to light.  Pulmonary:     Effort: Pulmonary effort is normal. No respiratory distress.  Musculoskeletal:     Cervical back: Normal range of motion and neck supple.     Comments: Left 5th toe corn laterally. No erythema, warmth. Mild tenderness  to palpation.   Skin:    General: Skin is warm and dry.  Neurological:     Mental Status: He is alert and oriented to person, place, and time.        UC Treatments / Results  Labs (all labs ordered are listed, but only abnormal results are displayed) Labs Reviewed - No data to display  EKG   Radiology No results found.  Procedures Procedures (including critical care time)  Medications Ordered in UC Medications - No data to display  Initial Impression / Assessment and Plan / UC Course  I have reviewed the triage vital signs and the nursing notes.  Pertinent labs & imaging results that were available during my care of the patient were reviewed by me and considered in my medical decision making (see chart for details).    1. Left ear pain/foreign body Foreign body noted on exam, removed with ear irrigation. FB intact. No signs of otitis media/externa. Will have patient monitor if any residual symptoms. Return precautions given.  2. Corn of foot salicylic acid, donut dressing for pressure. PCP/dermatology follow up. Return precautions given.  Final Clinical Impressions(s) / UC Diagnoses   Final diagnoses:  Foreign body of left ear, initial encounter  Corn of foot    ED Prescriptions    None     PDMP not reviewed this encounter.   Belinda Fisher, PA-C 08/25/20 2248

## 2021-02-22 ENCOUNTER — Ambulatory Visit: Payer: BLUE CROSS/BLUE SHIELD | Admitting: Adult Health

## 2021-02-22 VITALS — BP 143/78 | HR 80 | Ht 73.0 in | Wt 300.0 lb

## 2021-02-22 DIAGNOSIS — Z9989 Dependence on other enabling machines and devices: Secondary | ICD-10-CM

## 2021-02-22 DIAGNOSIS — G4733 Obstructive sleep apnea (adult) (pediatric): Secondary | ICD-10-CM

## 2021-02-22 NOTE — Patient Instructions (Signed)
Continue using CPAP nightly and greater than 4 hours each night °If your symptoms worsen or you develop new symptoms please let us know.  ° °

## 2021-02-22 NOTE — Progress Notes (Signed)
PATIENT: Gabriela Eves. DOB: 1971-09-19  REASON FOR VISIT: follow up HISTORY FROM: patient  HISTORY OF PRESENT ILLNESS: Today 02/22/21:  Mr. Stehlin is a 50 year old male with a history of obstructive sleep apnea on CPAP.  He returns today for follow-up.  His download indicates that he uses his machine 29 out of 30 days for compliance of 97%.  He uses machine greater than 4 hours 24 days for compliance of 80%.  On average he uses it 5 hours and 31 minutes.  His residual AHI is 4.1 on 11 cm of water.  His leak in the 95th percentile is 61.9 L/min.  Reports that he has not changed his supplies out in 3 months.  He also do not have a filter on his machine.  HISTORY 05/13/2019:   Mr. Colavito is a 50 year old male with a history of obstructive sleep apnea on CPAP.  He returns today for follow-up.  His CPAP download indicates that he uses machine 29 out of 30 days for compliance of 97%.  He uses machine greater than 4 hours 22 out of 30 days for compliance of 73%.  On average he uses his machine 5 hours and 4 minutes.  His residual AHI is 3.1 on 11 cm of water with EPR 3.  He has a leak but percentile is 21.3 L/min.  He states that he is due for new supplies.  He reports that his straps and the water chamber are broken.  He states that [facility] and plans to pay that today.   REVIEW OF SYSTEMS: Out of a complete 14 system review of symptoms, the patient complains only of the following symptoms, and all other reviewed systems are negative.  FSS 26 ESS 8  ALLERGIES: No Known Allergies  HOME MEDICATIONS: Outpatient Medications Prior to Visit  Medication Sig Dispense Refill  . amLODipine (NORVASC) 5 MG tablet Take 5 mg by mouth daily.  2  . gabapentin (NEURONTIN) 300 MG capsule Take 300 mg by mouth.     . losartan-hydrochlorothiazide (HYZAAR) 100-12.5 MG tablet Take 1 tablet by mouth daily.  2  . meloxicam (MOBIC) 15 MG tablet Take 15 mg by mouth as needed.   1  . methylphenidate (RITALIN) 20 MG tablet TAKE 1 TABLET BY MOUTH IN THE MORNING  0  . omeprazole (PRILOSEC) 20 MG capsule Take 20 mg by mouth daily.     No facility-administered medications prior to visit.    PAST MEDICAL HISTORY: Past Medical History:  Diagnosis Date  . Hypertension   . OSA (obstructive sleep apnea)     PAST SURGICAL HISTORY: Past Surgical History:  Procedure Laterality Date  . VASECTOMY      FAMILY HISTORY: Family History  Problem Relation Age of Onset  . Hypertension Father     SOCIAL HISTORY: Social History   Socioeconomic History  . Marital status: Single    Spouse name: Not on file  . Number of children: Not on file  . Years of education: Not on file  . Highest education level: Not on file  Occupational History  . Not on file  Tobacco Use  . Smoking status: Light Tobacco Smoker    Types: Cigars  . Smokeless tobacco: Never Used  . Tobacco comment: smoke once a month  Vaping Use  . Vaping Use: Never used  Substance and Sexual Activity  . Alcohol use: No    Alcohol/week: 0.0 standard drinks  Comment: 1 beer a week  . Drug use: No  . Sexual activity: Not on file  Other Topics Concern  . Not on file  Social History Narrative   No caffeine consumption.   Social Determinants of Health   Financial Resource Strain: Not on file  Food Insecurity: Not on file  Transportation Needs: Not on file  Physical Activity: Not on file  Stress: Not on file  Social Connections: Not on file  Intimate Partner Violence: Not on file      PHYSICAL EXAM  Vitals:   02/22/21 0929  BP: (!) 143/78  Pulse: 80  Weight: 300 lb (136.1 kg)  Height: 6\' 1"  (1.854 m)   Body mass index is 39.58 kg/m.  Generalized: Well developed, in no acute distress  Chest: Lungs clear to auscultation bilaterally  Neurological examination  Mentation: Alert oriented to time, place, history taking. Follows all commands speech and language fluent Cranial nerve  II-XII: Extraocular movements were full, visual field were full on confrontational test Head turning and shoulder shrug  were normal and symmetric. Motor: The motor testing reveals 5 over 5 strength of all 4 extremities. Good symmetric motor tone is noted throughout.  Sensory: Sensory testing is intact to soft touch on all 4 extremities. No evidence of extinction is noted.  Gait and station: Gait is normal.    DIAGNOSTIC DATA (LABS, IMAGING, TESTING) - I reviewed patient records, labs, notes, testing and imaging myself where available.    ASSESSMENT AND PLAN 50 y.o. year old male  has a past medical history of Hypertension and OSA (obstructive sleep apnea). here with:  1. OSA on CPAP  - CPAP compliance excellent - Good treatment of AHI  -Patient has a high leak-sent in order for new supplies - Encourage patient to use CPAP nightly and > 4 hours each night - F/U in 1 year or sooner if needed   I spent 25 minutes of face-to-face and non-face-to-face time with patient.  This included previsit chart review, lab review, study review, order entry, electronic health record documentation, patient education.  54, MSN, NP-C 02/22/2021, 9:29 AM [facility] [address], [address] [place], [place] [phone]

## 2021-02-24 NOTE — Progress Notes (Signed)
Cm sent to [facility] 

## 2021-11-22 ENCOUNTER — Other Ambulatory Visit: Payer: Self-pay

## 2021-11-22 ENCOUNTER — Emergency Department (HOSPITAL_BASED_OUTPATIENT_CLINIC_OR_DEPARTMENT_OTHER)
Admission: EM | Admit: 2021-11-22 | Discharge: 2021-11-22 | Disposition: A | Discharge status: Home / Self Care | Payer: BLUE CROSS/BLUE SHIELD | Attending: Emergency Medicine | Admitting: Emergency Medicine

## 2021-11-22 ENCOUNTER — Encounter (HOSPITAL_BASED_OUTPATIENT_CLINIC_OR_DEPARTMENT_OTHER): Payer: Self-pay | Admitting: Emergency Medicine

## 2021-11-22 DIAGNOSIS — Y9343 Activity, gymnastics: Secondary | ICD-10-CM | POA: Diagnosis not present

## 2021-11-22 DIAGNOSIS — S76211A Strain of adductor muscle, fascia and tendon of right thigh, initial encounter: Secondary | ICD-10-CM | POA: Insufficient documentation

## 2021-11-22 DIAGNOSIS — X509XXA Other and unspecified overexertion or strenuous movements or postures, initial encounter: Secondary | ICD-10-CM | POA: Insufficient documentation

## 2021-11-22 DIAGNOSIS — F17290 Nicotine dependence, other tobacco product, uncomplicated: Secondary | ICD-10-CM | POA: Diagnosis not present

## 2021-11-22 DIAGNOSIS — Z79899 Other long term (current) drug therapy: Secondary | ICD-10-CM | POA: Diagnosis not present

## 2021-11-22 DIAGNOSIS — S79922A Unspecified injury of left thigh, initial encounter: Secondary | ICD-10-CM | POA: Diagnosis present

## 2021-11-22 DIAGNOSIS — S76212A Strain of adductor muscle, fascia and tendon of left thigh, initial encounter: Secondary | ICD-10-CM | POA: Diagnosis not present

## 2021-11-22 DIAGNOSIS — S76219A Strain of adductor muscle, fascia and tendon of unspecified thigh, initial encounter: Secondary | ICD-10-CM

## 2021-11-22 DIAGNOSIS — I10 Essential (primary) hypertension: Secondary | ICD-10-CM | POA: Insufficient documentation

## 2021-11-22 HISTORY — DX: Gastro-esophageal reflux disease without esophagitis: K21.9

## 2021-11-22 MED ORDER — IBUPROFEN 600 MG PO TABS: 600.0000 mg | ORAL_TABLET | Freq: Three times a day (TID) | ORAL | 0 refills | Status: AC

## 2021-11-22 MED ORDER — METHOCARBAMOL 500 MG PO TABS: 500.0000 mg | ORAL_TABLET | Freq: Two times a day (BID) | ORAL | 0 refills | Status: AC

## 2021-11-22 NOTE — Discharge Instructions (Addendum)
You were diagnosed with muscle strain of your inner thighs today from using the leg press machine at the gym. Fortunately, with your history and exam, imaging probably would not be beneficial for you today. I have sent you in a prescription for a muscle relaxer called Robaxin as well as prescription strength anti-inflammatory.  It is just can take some time for these muscles to heal.  Please stretch 2-3 times a day, apply heat to the area to help relax the muscle and take anti-inflammatories as needed for pain.  Please avoid using the muscle relaxer before driving as this can cause drowsiness.  Sometimes strains in these areas can take a few weeks to heal, but if you have continued pain please follow-up with your primary care doctor for more evaluation.  Please return to the ED if you develop urinary retention, urinary incontinence, numbness or tingling in your feet or your groin.

## 2021-11-22 NOTE — ED Provider Notes (Signed)
[facility] EMERGENCY DEPT Provider Note   CSN: [id number] Arrival date & time: 11/22/21  1036     History Chief Complaint  Patient presents with   Leg Pain    Wayne Nelson. is a 50 y.o. male   This is a 50 year old male with history of hypertension who presents for evaluation of bilateral inner thigh pain that is started after using a leg press machine at the gym 1 week ago.  Patient states that he had increased his weight and he can tell that his feet were spread too far apart when he is pressing on the machine, and since then he has had inner thigh pain rating to his groin.  Pain is aggravated upon first walking but improves with more motion.  It is worse in the morning when he first wakes up and tries to get out of bed.  Palpation does not aggravate his symptoms.  He has been taking ibuprofen at home without any relief.  He denies saddle paresthesia, urinary and bowel dysfunction, numbness tingling and back pain.   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   Obesity, morbid (HCC) 04/06/2015   OSA on CPAP 04/06/2015    Past Surgical History:  Procedure Laterality Date   VASECTOMY         Family History  Problem Relation Age of Onset   Hypertension Father     Social History   Tobacco Use   Smoking status: Light Smoker    Types: Cigars   Smokeless tobacco: Never   Tobacco comments:    smoke once a month  Vaping Use   Vaping Use: Never used  Substance Use Topics   Alcohol use: No    Alcohol/week: 0.0 standard drinks    Comment: 1 beer a week   Drug use: No    Home Medications Prior to Admission medications   Medication Sig Start Date End Date Taking? Authorizing Provider  ibuprofen  (ADVIL) 600 MG tablet Take 1 tablet (600 mg total) by mouth 3 (three) times daily for 14 days. 11/22/21 12/06/21 Yes Janell Quiet, PA-C  methocarbamol (ROBAXIN) 500 MG tablet Take 1 tablet (500 mg total) by mouth 2 (two) times daily. 11/22/21  Yes Raynald Blend R, PA-C  amLODipine (NORVASC) 5 MG tablet Take 5 mg by mouth daily. 04/23/17   [provider]  gabapentin (NEURONTIN) 300 MG capsule Take 300 mg by mouth.  05/03/18   [provider]  losartan-hydrochlorothiazide (HYZAAR) 100-12.5 MG tablet Take 1 tablet by mouth daily. 04/23/17   [provider]  meloxicam (MOBIC) 15 MG tablet Take 15 mg by mouth as needed. 04/12/17   [provider]  methylphenidate (RITALIN) 20 MG tablet TAKE 1 TABLET BY MOUTH IN THE MORNING 02/10/17   [provider]  omeprazole (PRILOSEC) 20 MG capsule Take 20 mg by mouth daily.    [provider]    Allergies    Patient has no known allergies.  Review of Systems   Review of Systems  Constitutional:  Negative for fever.  HENT: Negative.    Eyes: Negative.   Respiratory:  Negative for shortness of breath.   Cardiovascular: Negative.   Gastrointestinal:  Negative  for abdominal pain, diarrhea and vomiting.  Endocrine: Negative.   Genitourinary: Negative.   Musculoskeletal:  Positive for myalgias.  Skin:  Negative for rash.  Neurological:  Negative for headaches.  All other systems reviewed and are negative.  Physical Exam Updated Vital Signs BP (!) 144/81 (BP Location: Left Arm)    Pulse 80    Temp 98.9 F (37.2 C) (Oral)    Resp 20    Ht 6\' 1"  (1.854 m)    Wt (!) 137.7 kg    SpO2 98%    BMI 40.05 kg/m   Physical Exam Vitals and nursing note reviewed.  Constitutional:      General: He is not in acute distress.    Appearance: He is not ill-appearing.  HENT:     Head: Atraumatic.  Eyes:     Conjunctiva/sclera: Conjunctivae normal.  Cardiovascular:     Rate and Rhythm: Normal rate and regular  rhythm.     Pulses: Normal pulses.     Heart sounds: No murmur heard. Pulmonary:     Effort: Pulmonary effort is normal. No respiratory distress.     Breath sounds: Normal breath sounds.  Abdominal:     General: Abdomen is flat. There is no distension.     Palpations: Abdomen is soft.     Tenderness: There is no abdominal tenderness.  Musculoskeletal:        General: Normal range of motion.     Cervical back: Normal range of motion.     Comments: T-spine and L-spine nontender to palpation at midline. Patient moves all extremities without difficulty. All joints supple and easily movable, no erythema, swelling or palpable deformity, all compartments soft.  Medial thighs without palpable deformity, erythema, ecchymosis.  Nontender to palpation.  Pain aggravated upon hip abduction and abduction.   Skin:    General: Skin is warm and dry.     Capillary Refill: Capillary refill takes less than 2 seconds.  Neurological:     General: No focal deficit present.     Mental Status: He is alert.  Psychiatric:        Mood and Affect: Mood normal.    ED Results / Procedures / Treatments   Labs (all labs ordered are listed, but only abnormal results are displayed) Labs Reviewed - No data to display  EKG None  Radiology No results found.  Procedures Procedures   Medications Ordered in ED Medications - No data to display  ED Course  I have reviewed the triage vital signs and the nursing notes.  Pertinent labs & imaging results that were available during my care of the patient were reviewed by me and considered in my medical decision making (see chart for details).    MDM Rules/Calculators/A&P                         Given patient's overall reassuring history and physical exam, no imaging is indicated at this time.  Patient's symptoms are likely due to to muscle strain from using the leg press machine at the gym.  Discussed with him the expected length of time for symptoms to heal,  pain medication and muscle relaxers indicated at this time.  Discussed stretching, heat and movement to facilitate healing.  Also talked with him about symptoms that would warrant return to the ED.  If pain does not resolve in a few weeks, patient should follow-up with his primary care doctor.  Patient understands and is amenable to  plan.   Final Clinical Impression(s) / ED Diagnoses Final diagnoses:  Groin strain, unspecified laterality, initial encounter    Rx / DC Orders ED Discharge Orders          Ordered    methocarbamol (ROBAXIN) 500 MG tablet  2 times daily        11/22/21 1253    ibuprofen (ADVIL) 600 MG tablet  3 times daily        11/22/21 1253             Janell Quiet, [place] 11/22/21 1312    Gwyneth Sprout, MD 11/25/21 1312

## 2021-11-22 NOTE — ED Triage Notes (Signed)
Pain to bilateral thighs after doing a leg press at the gym 6 days ago.

## 2022-02-22 ENCOUNTER — Ambulatory Visit (INDEPENDENT_AMBULATORY_CARE_PROVIDER_SITE_OTHER): Payer: BLUE CROSS/BLUE SHIELD | Admitting: Adult Health

## 2022-02-22 ENCOUNTER — Encounter: Payer: Self-pay | Admitting: Adult Health

## 2022-02-22 VITALS — BP 127/74 | HR 79 | Ht 73.0 in | Wt 299.0 lb

## 2022-02-22 DIAGNOSIS — G4733 Obstructive sleep apnea (adult) (pediatric): Secondary | ICD-10-CM | POA: Diagnosis not present

## 2022-02-22 DIAGNOSIS — Z9989 Dependence on other enabling machines and devices: Secondary | ICD-10-CM

## 2022-02-22 NOTE — Progress Notes (Signed)
2

## 2022-02-22 NOTE — Progress Notes (Signed)
? ? ?PATIENT: Wayne Nelson. ?DOB: December 20, 1970 ? ?REASON FOR VISIT: follow up ?HISTORY FROM: patient ? ?Chief Complaint from [place]: Follow-up (Pt in 20  for CPAP follow up  Pt states he has refill his water reservoir 2x a night . Pt wants to know does he need his temp changed to prevent having to fill reservoir 2x nightly ) ? ? ? ?HISTORY OF PRESENT ILLNESS: ?Today 02/22/22: ? ?Wayne Nelson is a 51 year old male with a history of obstructive sleep apnea on CPAP.  He returns today for follow-up.  He reports that he does feel his mask leaking at night.  He is not interested in a mask refitting at this time states that he will try adjusting his mask to fit higher reports that he has to refill his water reservoir at least twice.  Has never tried adjusting the humidification ? ? ? ?02/22/21: Wayne Nelson is a 51 year old male with a history of obstructive sleep apnea on CPAP.  He returns today for follow-up.  His download indicates that he uses his machine 29 out of 30 days for compliance of 97%.  He uses machine greater than 4 hours 24 days for compliance of 80%.  On average he uses it 5 hours and 31 minutes.  His residual AHI is 4.1 on 11 cm of water.  His leak in the 95th percentile is 61.9 L/min.  Reports that he has not changed his supplies out in 3 months.  He also do not have a filter on his machine. ? ?HISTORY 05/13/2019:   ?Wayne Nelson is a 51 year old male with a history of obstructive sleep apnea on CPAP.  He returns today for follow-up.  His CPAP download indicates that he uses machine 29 out of 30 days for compliance of 97%.  He uses machine greater than 4 hours 22 out of 30 days for compliance of 73%.  On average he uses his machine 5 hours and 4 minutes.  His residual AHI is 3.1 on 11 cm of water with EPR 3.  He has a leak but percentile is 21.3 L/min.  He states that he is due for new supplies.  He reports that his straps and the water chamber are broken.  He states that [facility] and plans to pay that today. ? ? ?REVIEW OF SYSTEMS: Out of a complete 14 system review of symptoms, the patient complains only of the following symptoms, and all other reviewed systems are negative. ? ? ?ESS 5 ? ?ALLERGIES: ?No Known Allergies ? ?HOME MEDICATIONS: ?Outpatient Medications Prior to Visit  ?Medication Sig Dispense Refill  ? amLODipine (NORVASC) 5 MG tablet Take 5 mg by mouth daily.  2  ? hydrochlorothiazide (HYDRODIURIL) 12.5 MG tablet TAKE 1 TABLET BY MOUTH DAILY (TAKE WITH LOSARTAN)    ? losartan (COZAAR) 100 MG tablet TAKE 1 TABLET BY MOUTH DAILY (TAKE WITH HYDROCHLOROTHIAZIDE)    ? losartan-hydrochlorothiazide (HYZAAR) 100-12.5 MG tablet Take 1 tablet by mouth daily.  2  ? methylphenidate (RITALIN) 20 MG tablet TAKE 1 TABLET BY MOUTH IN THE MORNING  0  ? omeprazole (PRILOSEC) 20 MG capsule Take 20 mg by mouth daily.    ? gabapentin (NEURONTIN) 300 MG capsule Take 300 mg by mouth.     ? meloxicam (MOBIC) 15 MG tablet Take 15 mg by mouth as needed.  1  ? methocarbamol (ROBAXIN) 500 MG tablet Take 1 tablet (500 mg total) by mouth 2 (two) times daily.  20 tablet 0  ? ?No facility-administered medications prior to visit.  ? ? ?PAST MEDICAL HISTORY: ?Past Medical History:  ?Diagnosis Date  ? GERD (gastroesophageal reflux disease)   ? Hypertension   ? OSA (obstructive sleep apnea)   ? ? ?PAST SURGICAL HISTORY: ?Past Surgical History:  ?Procedure Laterality Date  ? VASECTOMY    ? ? ?FAMILY HISTORY: ?Family History  ?Problem Relation Age of Onset  ? Hypertension Father   ? Sleep apnea Neg Hx   ? ? ?SOCIAL HISTORY: ?Social History  ? ?Socioeconomic History  ? Marital status: Single  ?  Spouse name: Not on file  ? Number of children: Not on file  ? Years of education: Not on file  ? Highest education level: Not on file  ?Occupational History  ? Not on file  ?Tobacco Use  ? Smoking status: Light Smoker  ?  Types: Cigars  ? Smokeless tobacco: Never  ? Tobacco comments:  ?  smoke once a month  ?Vaping  Use  ? Vaping Use: Never used  ?Substance and Sexual Activity  ? Alcohol use: Yes  ?  Alcohol/week: 2.0 standard drinks  ?  Types: 2 Standard drinks or equivalent per week  ?  Comment: 1 beer a week  ? Drug use: No  ? Sexual activity: Not on file  ?Other Topics Concern  ? Not on file  ?Social History Narrative  ? No caffeine consumption.  ? ?Social Determinants of Health  ? ?Financial Resource Strain: Not on file  ?Food Insecurity: Not on file  ?Transportation Needs: Not on file  ?Physical Activity: Not on file  ?Stress: Not on file  ?Social Connections: Not on file  ?Intimate Partner Violence: Not on file  ? ? ? ? ?PHYSICAL EXAM ? ?There were no vitals filed for this visit. ? ?There is no height or weight on file to calculate BMI. ? ?Generalized: Well developed, in no acute distress  ?Chest: Lungs clear to auscultation bilaterally ? ?Neurological examination  ?Mentation: Alert oriented to time, place, history taking. Follows all commands speech and language fluent ?Cranial nerve II-XII: Extraocular movements were full, visual field were full on confrontational test Head turning and shoulder shrug  were normal and symmetric. ?Motor: The motor testing reveals 5 over 5 strength of all 4 extremities. Good symmetric motor tone is noted throughout.  ?Sensory: Sensory testing is intact to soft touch on all 4 extremities. No evidence of extinction is noted.  ?Gait and station: Gait is normal.  ? ? ?DIAGNOSTIC DATA (LABS, IMAGING, TESTING) ?- I reviewed patient records, labs, notes, testing and imaging myself where available. ? ? ? ?ASSESSMENT AND PLAN ?51 y.o. year old male  has a past medical history of GERD (gastroesophageal reflux disease), Hypertension, and OSA (obstructive sleep apnea). here with: ? ?OSA on CPAP ? ?- CPAP compliance excellent ?- Good treatment of AHI  ?-Patient has a high leak-sent in order for new supplies ?- Encourage patient to use CPAP nightly and > 4 hours each night ?-Advised patient to adjust  his humidification as that may prevent him from having to refill his water reservoir ?-Deferred on mask refitting will call if he decides to proceed ?- F/U in 1 year or sooner if needed ? ? ? ? ?Ward Givens, MSN, NP-C 02/22/2022, 9:38 AM ?[facility] ?[address], [address] ?[place], [place] [address] ?[phone] ? ? ?

## 2023-03-01 ENCOUNTER — Ambulatory Visit: Payer: BLUE CROSS/BLUE SHIELD | Admitting: Adult Health

## 2023-03-09 ENCOUNTER — Ambulatory Visit: Payer: BLUE CROSS/BLUE SHIELD | Admitting: Adult Health

## 2023-03-09 ENCOUNTER — Encounter: Payer: Self-pay | Admitting: Adult Health

## 2023-03-09 VITALS — BP 131/76 | HR 77 | Ht 72.0 in | Wt 297.0 lb

## 2023-03-09 DIAGNOSIS — G4733 Obstructive sleep apnea (adult) (pediatric): Secondary | ICD-10-CM

## 2023-03-09 NOTE — Progress Notes (Signed)
PATIENT: Wayne Nelson. DOB: 1971-03-17  REASON FOR VISIT: follow up HISTORY FROM: patient  Chief Complaint  Patient presents with   Follow-up    Pt in 4 Pt here for CPAP f/u Pt states no questions or concerns for today's visit        HISTORY OF PRESENT ILLNESS: Today 03/09/23:  Wayne Nelson. is a 52 y.o. male with a history of OSA on CPAP. Returns today for follow-up.  He reports that the CPAP is working well for him.  He denies any new issues.  He states due to his work schedule sometimes he is not able to get greater than 4 hours on the machine.     02/02/22: Wayne Nelson is a 52 year old male with a history of obstructive sleep apnea on CPAP.  He returns today for follow-up.  He reports that he does feel his mask leaking at night.  He is not interested in a mask refitting at this time states that he will try adjusting his mask to fit higher reports that he has to refill his water reservoir at least twice.  Has never tried adjusting the humidification    02/22/21: Wayne Nelson is a 52 year old male with a history of obstructive sleep apnea on CPAP.  He returns today for follow-up.  His download indicates that he uses his machine 29 out of 30 days for compliance of 97%.  He uses machine greater than 4 hours 24 days for compliance of 80%.  On average he uses it 5 hours and 31 minutes.  His residual AHI is 4.1 on 11 cm of water.  His leak in the 95th percentile is 61.9 L/min.  Reports that he has not changed his supplies out in 3 months.  He also do not have a filter on his machine.  HISTORY 05/13/2019:   Wayne Nelson is a 52 year old male with a history of obstructive sleep apnea on CPAP.  He returns today for follow-up.  His CPAP download indicates that he uses machine 29 out of 30 days for compliance of 97%.  He uses machine greater than 4 hours 22 out of 30 days for compliance of 73%.  On average he uses his machine 5 hours and 4 minutes.  His residual AHI is 3.1 on 11 cm of  water with EPR 3.  He has a leak but percentile is 21.3 L/min.  He states that he is due for new supplies.  He reports that his straps and the water chamber are broken.  He states that [facility] and plans to pay that today.   REVIEW OF SYSTEMS: Out of a complete 14 system review of symptoms, the patient complains only of the following symptoms, and all other reviewed systems are negative.   ESS9  ALLERGIES: No Known Allergies  HOME MEDICATIONS: Outpatient Medications Prior to Visit  Medication Sig Dispense Refill   amLODipine (NORVASC) 5 MG tablet Take 5 mg by mouth daily.  2   hydrochlorothiazide (HYDRODIURIL) 12.5 MG tablet TAKE 1 TABLET BY MOUTH DAILY (TAKE WITH LOSARTAN)     losartan (COZAAR) 100 MG tablet TAKE 1 TABLET BY MOUTH DAILY (TAKE WITH HYDROCHLOROTHIAZIDE)     methylphenidate (RITALIN) 20 MG tablet TAKE 1 TABLET BY MOUTH IN THE MORNING  0   omeprazole (PRILOSEC) 20 MG capsule Take 20 mg by mouth daily.     gabapentin (NEURONTIN) 300 MG capsule Take 300 mg by mouth.  losartan-hydrochlorothiazide (HYZAAR) 100-12.5 MG tablet Take 1 tablet by mouth daily. (Patient not taking: Reported on 03/09/2023)  2   meloxicam (MOBIC) 15 MG tablet Take 15 mg by mouth as needed.  1   methocarbamol (ROBAXIN) 500 MG tablet Take 1 tablet (500 mg total) by mouth 2 (two) times daily. 20 tablet 0   No facility-administered medications prior to visit.    PAST MEDICAL HISTORY: Past Medical History:  Diagnosis Date   GERD (gastroesophageal reflux disease)    Hypertension    OSA (obstructive sleep apnea)     PAST SURGICAL HISTORY: Past Surgical History:  Procedure Laterality Date   VASECTOMY      FAMILY HISTORY: Family History  Problem Relation Age of Onset   Hypertension Father    Sleep apnea Neg Hx     SOCIAL HISTORY: Social History   Socioeconomic History   Marital status: Single    Spouse name: Not on file   Number of children: Not on  file   Years of education: Not on file   Highest education level: Not on file  Occupational History   Not on file  Tobacco Use   Smoking status: Light Smoker    Types: Cigars   Smokeless tobacco: Never   Tobacco comments:    smoke once a month  Vaping Use   Vaping Use: Never used  Substance and Sexual Activity   Alcohol use: Yes    Alcohol/week: 2.0 standard drinks of alcohol    Types: 2 Standard drinks or equivalent per week    Comment: occ   Drug use: No   Sexual activity: Not on file  Other Topics Concern   Not on file  Social History Narrative   No caffeine consumption.   Social Determinants of Health   Financial Resource Strain: Not on file  Food Insecurity: Not on file  Transportation Needs: Not on file  Physical Activity: Not on file  Stress: Not on file  Social Connections: Not on file  Intimate Partner Violence: Not on file      PHYSICAL EXAM  Vitals:   03/09/23 0943  BP: 131/76  Pulse: 77  Weight: 297 lb (134.7 kg)  Height: 6' (1.829 m)    Body mass index is 40.28 kg/m.  Generalized: Well developed, in no acute distress  Chest: Lungs clear to auscultation bilaterally  Neurological examination  Mentation: Alert oriented to time, place, history taking. Follows all commands speech and language fluent Cranial nerve II-XII: Facial symmetry noted   DIAGNOSTIC DATA (LABS, IMAGING, TESTING) - I reviewed patient records, labs, notes, testing and imaging myself where available.    ASSESSMENT AND PLAN 52 y.o. year old male  has a past medical history of GERD (gastroesophageal reflux disease), Hypertension, and OSA (obstructive sleep apnea). here with:  OSA on CPAP  - CPAP compliance excellent - Good treatment of AHI  - Encourage patient to use CPAP nightly and > 4 hours each night - F/U in 1 year or sooner if needed     Butch Penny, MSN, NP-C 03/09/2023, 10:16 AM [facility] [address] [place], [place]  [address] [phone]

## 2023-03-09 NOTE — Patient Instructions (Signed)
Continue using CPAP nightly and greater than 4 hours each night °If your symptoms worsen or you develop new symptoms please let us know.  ° °

## 2023-05-24 DIAGNOSIS — G4733 Obstructive sleep apnea (adult) (pediatric): Secondary | ICD-10-CM | POA: Diagnosis not present

## 2023-06-23 DIAGNOSIS — G4733 Obstructive sleep apnea (adult) (pediatric): Secondary | ICD-10-CM | POA: Diagnosis not present

## 2023-07-24 DIAGNOSIS — G4733 Obstructive sleep apnea (adult) (pediatric): Secondary | ICD-10-CM | POA: Diagnosis not present

## 2023-08-23 DIAGNOSIS — G4733 Obstructive sleep apnea (adult) (pediatric): Secondary | ICD-10-CM | POA: Diagnosis not present

## 2023-08-24 DIAGNOSIS — G4733 Obstructive sleep apnea (adult) (pediatric): Secondary | ICD-10-CM | POA: Diagnosis not present

## 2023-09-22 DIAGNOSIS — G4733 Obstructive sleep apnea (adult) (pediatric): Secondary | ICD-10-CM | POA: Diagnosis not present

## 2023-10-23 DIAGNOSIS — G4733 Obstructive sleep apnea (adult) (pediatric): Secondary | ICD-10-CM | POA: Diagnosis not present

## 2023-11-06 ENCOUNTER — Telehealth: Payer: Self-pay | Admitting: Adult Health

## 2023-11-06 NOTE — Telephone Encounter (Signed)
Thank you so much

## 2023-11-06 NOTE — Telephone Encounter (Signed)
Pt called stating he will pick up cpap sometime this week when he has a day off. I gave him [facility]'s phone number. He will call us back to schedule appt once he has his machine.

## 2023-12-06 DIAGNOSIS — G4733 Obstructive sleep apnea (adult) (pediatric): Secondary | ICD-10-CM | POA: Diagnosis not present

## 2024-01-06 DIAGNOSIS — G4733 Obstructive sleep apnea (adult) (pediatric): Secondary | ICD-10-CM | POA: Diagnosis not present

## 2024-02-03 DIAGNOSIS — G4733 Obstructive sleep apnea (adult) (pediatric): Secondary | ICD-10-CM | POA: Diagnosis not present

## 2024-03-06 DIAGNOSIS — G4733 Obstructive sleep apnea (adult) (pediatric): Secondary | ICD-10-CM | POA: Diagnosis not present

## 2024-03-14 ENCOUNTER — Ambulatory Visit: Payer: BLUE CROSS/BLUE SHIELD | Admitting: Adult Health

## 2024-03-20 ENCOUNTER — Telehealth: Payer: Self-pay

## 2024-03-20 NOTE — Telephone Encounter (Signed)
 Called pt and asked him to bring his CPAP Machine with his to his appointment on tomorrow 03/21/2024.

## 2024-03-21 ENCOUNTER — Ambulatory Visit (INDEPENDENT_AMBULATORY_CARE_PROVIDER_SITE_OTHER): Payer: BLUE CROSS/BLUE SHIELD | Admitting: Adult Health

## 2024-03-21 ENCOUNTER — Encounter: Payer: Self-pay | Admitting: Adult Health

## 2024-03-21 VITALS — BP 131/71 | HR 84 | Ht 73.0 in | Wt 307.2 lb

## 2024-03-21 DIAGNOSIS — G4733 Obstructive sleep apnea (adult) (pediatric): Secondary | ICD-10-CM

## 2024-03-21 NOTE — Progress Notes (Signed)
 Wayne Nelson

## 2024-03-21 NOTE — Patient Instructions (Addendum)
 Continue using CPAP nightly and greater than 4 hours each night Order placed for home sleep test If your symptoms worsen or you develop new symptoms please let us  know.

## 2024-03-21 NOTE — Progress Notes (Signed)
 PATIENT: Wayne Nelson. DOB: Sep 22, 1971  REASON FOR VISIT: follow up HISTORY FROM: patient  Chief Complaint  Patient presents with   Room 19    Pt is here Alone.  Pt states that everything is going good with his CPAP Machine.        HISTORY OF PRESENT ILLNESS: Today 03/21/24:  Wayne Nelson. is a 53 y.o. male with a history of OSA on CPAP. Returns today for follow-up. He didn't bring his power cord with him today.  We were unable to get a download.  He does state that he will bring it back by our office today.  He states that he uses the CPAP every night.  He does not like to sleep without it.  His last sleep study was before 2014.  He has not had a replacement machine.  He returns today for an evaluation.     03/09/23: Kobie R Bedgood Jr. is a 53 y.o. male with a history of OSA on CPAP. Returns today for follow-up.  He reports that the CPAP is working well for him.  He denies any new issues.  He states due to his work schedule sometimes he is not able to get greater than 4 hours on the machine.     02/02/22: Mr. Randle is a 53 year old male with a history of obstructive sleep apnea on CPAP.  He returns today for follow-up.  He reports that he does feel his mask leaking at night.  He is not interested in a mask refitting at this time states that he will try adjusting his mask to fit higher reports that he has to refill his water reservoir at least twice.  Has never tried adjusting the humidification    02/22/21: Mr. Gang is a 53 year old male with a history of obstructive sleep apnea on CPAP.  He returns today for follow-up.  His download indicates that he uses his machine 29 out of 30 days for compliance of 97%.  He uses machine greater than 4 hours 24 days for compliance of 80%.  On average he uses it 5 hours and 31 minutes.  His residual AHI is 4.1 on 11 cm of water.  His leak in the 95th percentile is 61.9 L/min.  Reports that he has not changed his supplies out in  3 months.  He also do not have a filter on his machine.  HISTORY 05/13/2019:   Mr. Lips is a 53 year old male with a history of obstructive sleep apnea on CPAP.  He returns today for follow-up.  His CPAP download indicates that he uses machine 29 out of 30 days for compliance of 97%.  He uses machine greater than 4 hours 22 out of 30 days for compliance of 73%.  On average he uses his machine 5 hours and 4 minutes.  His residual AHI is 3.1 on 11 cm of water with EPR 3.  He has a leak but percentile is 21.3 L/min.  He states that he is due for new supplies.  He reports that his straps and the water chamber are broken.  He states that [facility] and plans to pay that today.   REVIEW OF SYSTEMS: Out of a complete 14 system review of symptoms, the patient complains only of the following symptoms, and all other reviewed systems are negative.   ESS7  ALLERGIES: No Known Allergies  HOME MEDICATIONS: Outpatient Medications Prior to Visit  Medication Sig Dispense  Refill   amLODipine (NORVASC) 5 MG tablet Take 5 mg by mouth daily.  2   hydrochlorothiazide (HYDRODIURIL) 12.5 MG tablet TAKE 1 TABLET BY MOUTH DAILY (TAKE WITH LOSARTAN)     losartan (COZAAR) 100 MG tablet TAKE 1 TABLET BY MOUTH DAILY (TAKE WITH HYDROCHLOROTHIAZIDE)     meloxicam  (MOBIC ) 15 MG tablet Take 15 mg by mouth as needed.  1   methylphenidate (RITALIN) 20 MG tablet TAKE 1 TABLET BY MOUTH IN THE MORNING  0   omeprazole (PRILOSEC) 20 MG capsule Take 20 mg by mouth daily.     gabapentin (NEURONTIN) 300 MG capsule Take 300 mg by mouth.  (Patient not taking: Reported on 03/21/2024)     losartan-hydrochlorothiazide (HYZAAR) 100-12.5 MG tablet Take 1 tablet by mouth daily. (Patient not taking: Reported on 03/21/2024)  2   methocarbamol  (ROBAXIN ) 500 MG tablet Take 1 tablet (500 mg total) by mouth 2 (two) times daily. (Patient not taking: Reported on 03/21/2024) 20 tablet 0   No facility-administered  medications prior to visit.    PAST MEDICAL HISTORY: Past Medical History:  Diagnosis Date   GERD (gastroesophageal reflux disease)    Hypertension    OSA (obstructive sleep apnea)     PAST SURGICAL HISTORY: Past Surgical History:  Procedure Laterality Date   VASECTOMY      FAMILY HISTORY: Family History  Problem Relation Age of Onset   Hypertension Father    Sleep apnea Neg Hx     SOCIAL HISTORY: Social History   Socioeconomic History   Marital status: Married    Spouse name: Not on file   Number of children: Not on file   Years of education: Not on file   Highest education level: Not on file  Occupational History   Not on file  Tobacco Use   Smoking status: Light Smoker    Types: Cigars   Smokeless tobacco: Never   Tobacco comments:    smoke once a month  Vaping Use   Vaping status: Never Used  Substance and Sexual Activity   Alcohol use: Yes    Alcohol/week: 2.0 standard drinks of alcohol    Types: 2 Standard drinks or equivalent per week    Comment: occ   Drug use: No   Sexual activity: Not on file  Other Topics Concern   Not on file  Social History Narrative   No caffeine consumption.   Social Drivers of Corporate investment banker Strain: Not on file  Food Insecurity: Not on file  Transportation Needs: Not on file  Physical Activity: Not on file  Stress: Not on file  Social Connections: Not on file  Intimate Partner Violence: Not on file      PHYSICAL EXAM  Vitals:   03/21/24 1109  Weight: (!) 307 lb 3.2 oz (139.3 kg)  Height: 6\' 1"  (1.854 m)    Body mass index is 40.53 kg/m.  Generalized: Well developed, in no acute distress  Chest: Lungs clear to auscultation bilaterally  Neurological examination  Mentation: Alert oriented to time, place, history taking. Follows all commands speech and language fluent Cranial nerve II-XII: Facial symmetry noted   DIAGNOSTIC DATA (LABS, IMAGING, TESTING) - I reviewed patient records, labs,  notes, testing and imaging myself where available.    ASSESSMENT AND PLAN 53 y.o. year old male  has a past medical history of GERD (gastroesophageal reflux disease), Hypertension, and OSA (obstructive sleep apnea). here with:  OSA on CPAP  - Patient will bring his  CPAP machine back by the office for download -We will repeat home sleep test.  Pending results we will order new machine -Encourage patient to use CPAP nightly and greater than 4 hours each night - F/U after he gets the new machine     Clem Currier, MSN, NP-C 03/21/2024, 11:15 AM [facility] [address], [address] [place], [place] [address] [phone]

## 2024-03-27 ENCOUNTER — Telehealth: Payer: Self-pay | Admitting: Adult Health

## 2024-03-27 NOTE — Telephone Encounter (Signed)
 [facility] [employer] pending we are out of network is in review

## 2024-04-02 NOTE — Telephone Encounter (Signed)
 [facility] [employer] denied bc we are out of network pt will have to pay for study

## 2024-04-05 DIAGNOSIS — G4733 Obstructive sleep apnea (adult) (pediatric): Secondary | ICD-10-CM | POA: Diagnosis not present

## 2024-05-06 DIAGNOSIS — G4733 Obstructive sleep apnea (adult) (pediatric): Secondary | ICD-10-CM | POA: Diagnosis not present

## 2024-06-05 DIAGNOSIS — G4733 Obstructive sleep apnea (adult) (pediatric): Secondary | ICD-10-CM | POA: Diagnosis not present

## 2024-06-17 DIAGNOSIS — Z125 Encounter for screening for malignant neoplasm of prostate: Secondary | ICD-10-CM | POA: Diagnosis not present

## 2024-06-17 DIAGNOSIS — I10 Essential (primary) hypertension: Secondary | ICD-10-CM | POA: Diagnosis not present

## 2024-06-17 DIAGNOSIS — E785 Hyperlipidemia, unspecified: Secondary | ICD-10-CM | POA: Diagnosis not present

## 2024-09-04 DIAGNOSIS — G4733 Obstructive sleep apnea (adult) (pediatric): Secondary | ICD-10-CM | POA: Diagnosis not present
